# Patient Record
Sex: Male | Born: 1960 | Race: White | Hispanic: No | Marital: Married | Smoking: Current every day smoker
Health system: Southern US, Community
[De-identification: ages and names within clinical notes are randomized; demographics above are authoritative.]

## PROBLEM LIST (undated history)

## (undated) DIAGNOSIS — G4733 Obstructive sleep apnea (adult) (pediatric): Secondary | ICD-10-CM

## (undated) DIAGNOSIS — F17200 Nicotine dependence, unspecified, uncomplicated: Secondary | ICD-10-CM

## (undated) DIAGNOSIS — R413 Other amnesia: Secondary | ICD-10-CM

## (undated) DIAGNOSIS — J449 Chronic obstructive pulmonary disease, unspecified: Secondary | ICD-10-CM

## (undated) DIAGNOSIS — F419 Anxiety disorder, unspecified: Secondary | ICD-10-CM

## (undated) DIAGNOSIS — I2510 Atherosclerotic heart disease of native coronary artery without angina pectoris: Principal | ICD-10-CM

## (undated) DIAGNOSIS — E785 Hyperlipidemia, unspecified: Secondary | ICD-10-CM

## (undated) HISTORY — PX: APPENDECTOMY: SHX54

## (undated) HISTORY — DX: Obstructive sleep apnea (adult) (pediatric): G47.33

## (undated) HISTORY — DX: Other amnesia: R41.3

## (undated) HISTORY — DX: Nicotine dependence, unspecified, uncomplicated: F17.200

## (undated) HISTORY — DX: Chronic obstructive pulmonary disease, unspecified: J44.9

## (undated) HISTORY — DX: Hyperlipidemia, unspecified: E78.5

## (undated) HISTORY — DX: Anxiety disorder, unspecified: F41.9

## (undated) HISTORY — PX: CHOLECYSTECTOMY: SHX55

## (undated) HISTORY — PX: KNEE SURGERY: SHX244

## (undated) HISTORY — DX: Atherosclerotic heart disease of native coronary artery without angina pectoris: I25.10

## (undated) HISTORY — PX: LUNG BIOPSY: SHX232

---

## 2004-03-12 ENCOUNTER — Ambulatory Visit: Payer: Self-pay | Admitting: Family Medicine

## 2004-06-09 ENCOUNTER — Ambulatory Visit: Payer: Self-pay | Admitting: Internal Medicine

## 2004-07-04 ENCOUNTER — Ambulatory Visit: Payer: Self-pay | Admitting: Family Medicine

## 2004-07-30 ENCOUNTER — Ambulatory Visit: Payer: Self-pay | Admitting: Family Medicine

## 2005-04-30 ENCOUNTER — Ambulatory Visit: Payer: Self-pay | Admitting: Family Medicine

## 2005-05-21 ENCOUNTER — Ambulatory Visit: Payer: Self-pay | Admitting: Family Medicine

## 2005-06-22 ENCOUNTER — Ambulatory Visit: Payer: Self-pay | Admitting: Family Medicine

## 2005-12-04 ENCOUNTER — Ambulatory Visit: Payer: Self-pay | Admitting: Family Medicine

## 2005-12-15 ENCOUNTER — Ambulatory Visit: Payer: Self-pay | Admitting: Family Medicine

## 2006-03-12 ENCOUNTER — Ambulatory Visit: Payer: Self-pay | Admitting: Family Medicine

## 2006-04-02 ENCOUNTER — Ambulatory Visit: Payer: Self-pay | Admitting: Family Medicine

## 2006-05-10 ENCOUNTER — Ambulatory Visit: Payer: Self-pay | Admitting: Family Medicine

## 2006-05-10 LAB — CONVERTED CEMR LAB
Basophils Absolute: 0.1 10*3/uL (ref 0.0–0.1)
Basophils Relative: 0.5 % (ref 0.0–1.0)
Cholesterol: 170 mg/dL (ref 0–200)
HCT: 42.1 % (ref 39.0–52.0)
HDL: 24.7 mg/dL — ABNORMAL LOW (ref >39.0)
Hemoglobin: 14.3 g/dL (ref 13.0–17.0)
LDL Cholesterol: 108 mg/dL — ABNORMAL HIGH (ref 0–99)
Lymphocytes Relative: 21 % (ref 12.0–46.0)
MCHC: 33.9 g/dL (ref 30.0–36.0)
Monocytes Absolute: 0.6 10*3/uL (ref 0.2–0.7)
Monocytes Relative: 4 % (ref 3.0–11.0)
Neutro Abs: 10.1 10*3/uL — ABNORMAL HIGH (ref 1.4–7.7)
Neutrophils Relative %: 73.4 % (ref 43.0–77.0)
RDW: 12.9 % (ref 11.5–14.6)

## 2006-05-24 ENCOUNTER — Encounter (INDEPENDENT_AMBULATORY_CARE_PROVIDER_SITE_OTHER): Payer: Self-pay | Admitting: Internal Medicine

## 2006-07-17 ENCOUNTER — Emergency Department: Payer: Self-pay | Admitting: Emergency Medicine

## 2006-07-22 ENCOUNTER — Ambulatory Visit: Payer: Self-pay | Admitting: Family Medicine

## 2006-07-22 DIAGNOSIS — S51809A Unspecified open wound of unspecified forearm, initial encounter: Secondary | ICD-10-CM | POA: Insufficient documentation

## 2006-07-22 DIAGNOSIS — T148XXA Other injury of unspecified body region, initial encounter: Secondary | ICD-10-CM | POA: Insufficient documentation

## 2007-01-20 DIAGNOSIS — I2510 Atherosclerotic heart disease of native coronary artery without angina pectoris: Secondary | ICD-10-CM

## 2007-01-20 HISTORY — PX: CARDIAC CATHETERIZATION: SHX172

## 2007-01-20 HISTORY — DX: Atherosclerotic heart disease of native coronary artery without angina pectoris: I25.10

## 2007-01-28 ENCOUNTER — Ambulatory Visit: Payer: Self-pay | Admitting: Family Medicine

## 2007-01-28 DIAGNOSIS — F17200 Nicotine dependence, unspecified, uncomplicated: Secondary | ICD-10-CM

## 2007-01-28 DIAGNOSIS — M79609 Pain in unspecified limb: Secondary | ICD-10-CM | POA: Insufficient documentation

## 2007-01-31 ENCOUNTER — Encounter: Payer: Self-pay | Admitting: Family Medicine

## 2007-06-14 ENCOUNTER — Ambulatory Visit: Payer: Self-pay | Admitting: Family Medicine

## 2007-06-14 DIAGNOSIS — F418 Other specified anxiety disorders: Secondary | ICD-10-CM | POA: Insufficient documentation

## 2007-06-14 DIAGNOSIS — J309 Allergic rhinitis, unspecified: Secondary | ICD-10-CM | POA: Insufficient documentation

## 2007-07-25 ENCOUNTER — Encounter (INDEPENDENT_AMBULATORY_CARE_PROVIDER_SITE_OTHER): Payer: Self-pay | Admitting: *Deleted

## 2007-08-05 ENCOUNTER — Encounter (INDEPENDENT_AMBULATORY_CARE_PROVIDER_SITE_OTHER): Payer: Self-pay | Admitting: *Deleted

## 2007-09-19 LAB — HM COLONOSCOPY: HM Colonoscopy: NORMAL

## 2008-06-08 ENCOUNTER — Telehealth: Payer: Self-pay | Admitting: Family Medicine

## 2008-12-10 ENCOUNTER — Emergency Department: Payer: Self-pay | Admitting: Emergency Medicine

## 2010-10-16 ENCOUNTER — Ambulatory Visit (INDEPENDENT_AMBULATORY_CARE_PROVIDER_SITE_OTHER): Payer: BC Managed Care – PPO | Admitting: Cardiovascular Disease

## 2010-10-16 ENCOUNTER — Encounter: Payer: Self-pay | Admitting: Cardiovascular Disease

## 2010-10-16 DIAGNOSIS — I2510 Atherosclerotic heart disease of native coronary artery without angina pectoris: Secondary | ICD-10-CM

## 2010-10-16 DIAGNOSIS — E785 Hyperlipidemia, unspecified: Secondary | ICD-10-CM | POA: Insufficient documentation

## 2010-10-16 DIAGNOSIS — J984 Other disorders of lung: Secondary | ICD-10-CM

## 2010-10-16 DIAGNOSIS — F17200 Nicotine dependence, unspecified, uncomplicated: Secondary | ICD-10-CM

## 2010-10-16 NOTE — Assessment & Plan Note (Signed)
We have discussed his smoking with him. This is the main priority for his medical care. Given his underlying severe lung disease and severe coronary disease, he has to stop smoking and we have stressed this to him. His wife also smokes and we have expressed this to her as well that it will be impossible for him to stop without her stopping.

## 2010-10-16 NOTE — Assessment & Plan Note (Signed)
History of severe coronary artery disease, stenting of his distal RCA and distal circumflex also with residual disease in 2009 at the time of his previous cardiac catheterization. Currently with no significant chest pain. Last stress test was in 2009 per the notes. He continues to be high risk given his continued smoking.

## 2010-10-16 NOTE — Assessment & Plan Note (Signed)
Given his severe underlying coronary artery disease and continued smoking, goal LDL less than 70, total cholesterol less than 150. We'll try to obtain his most recent lipid panel for our records.

## 2010-10-16 NOTE — Patient Instructions (Addendum)
You are doing well. Please increase the aspirin to two a day Please call us if you have new issues that need to be addressed before your next appt.  We will call you for a follow up Appt. In 6 months

## 2010-10-16 NOTE — Progress Notes (Signed)
Patient ID: Dennis Barajas, male    DOB: 18-May-1960, 50 y.o.   MRN: [id number]  HPI Comments: Mr. 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  He started smoking when he was 17, total 30 years.  Reports that his lung function is poor. He continues to smoke one pack per day. Notes indicate COPD, history of interstitial pneumonitis, possible interstitial lung disease. He is trying to quit. He has Chantix at home though has not started using her medication. He reports the stress at home has been significant as his 4 daughters ( who do not live at home) per causing significant stress when they call. His wife also smokes.   He denies any significant chest pain. Is not on oxygen.  EKG shows normal sinus rhythm with rate 80 beats per minute with no significant ST or T wave changes   Outpatient Encounter Prescriptions as of 10/16/2010  Medication Sig Dispense Refill  . albuterol (PROVENTIL) (2.5 MG/3ML) 0.083% nebulizer solution Take 2.5 mg by nebulization every 6 (six) hours as needed.        Marland Kitchen aspirin 81 MG tablet Take 81 mg by mouth daily.        Marland Kitchen azelastine (ASTELIN) 137 MCG/SPRAY nasal spray Place 1 spray into the nose 2 (two) times daily. Use in each nostril as directed       . buPROPion (WELLBUTRIN XL) 150 MG 24 hr tablet 150 mg. Take two tablets daily.       . fluticasone-salmeterol (ADVAIR HFA) 115-21 MCG/ACT inhaler Inhale 2 puffs into the lungs 2 (two) times daily.        . hydrOXYzine (VISTARIL) 50 MG capsule  Take 50 mg by mouth 3 (three) times daily as needed.        Marland Kitchen lisinopril (PRINIVIL,ZESTRIL) 2.5 MG tablet Take 2.5 mg by mouth daily.        . niacin (NIASPAN) 1000 MG CR tablet Take 1,000 mg by mouth at bedtime.        . nitroGLYCERIN (NITROSTAT) 0.4 MG SL tablet Place 0.4 mg under the tongue every 5 (five) minutes as needed.        Marland Kitchen PARoxetine (PAXIL-CR) 25 MG 24 hr tablet Take 25 mg by mouth every morning.        . pravastatin (PRAVACHOL) 10 MG tablet Take 10 mg by mouth daily.        Marland Kitchen sulfamethoxazole-trimethoprim (BACTRIM DS) 800-160 MG per tablet 1 tablet. Take one tablet 3 times weekly.       . Testosterone (TESTIM TD) Place 1 % onto the skin 1 day or 1 dose.        . tiotropium (SPIRIVA) 18 MCG inhalation capsule Place 18 mcg into inhaler and inhale daily.           Review of Systems  Constitutional: Negative.   HENT: Negative.   Eyes: Negative.   Respiratory: Positive for cough and shortness of breath.   Cardiovascular: Negative.   Gastrointestinal: Negative.   Musculoskeletal: Negative.   Skin: Negative.   Neurological: Negative.   Hematological: Negative.  Psychiatric/Behavioral: Negative.   All other systems reviewed and are negative.    BP 135/92  Pulse 87  Ht 5\' 11"  (1.803 m)  Wt 161 lb (73.029 kg)  BMI 22.45 kg/m2   Physical Exam  Nursing note and vitals reviewed. Constitutional: He is oriented to person, place, and time. He appears well-developed and well-nourished.  HENT:  Head: Normocephalic.  Nose: Nose normal.  Mouth/Throat: Oropharynx is clear and moist.  Eyes: Conjunctivae are normal. Pupils are equal, round, and reactive to light.  Neck: Normal range of motion. Neck supple. No JVD present.  Cardiovascular: Normal rate, regular rhythm, S1 normal, S2 normal, normal heart sounds and intact distal pulses.  Exam reveals no gallop and no friction rub.   No murmur heard. Pulmonary/Chest: He is in respiratory distress. He has wheezes. He has rales. He  exhibits no tenderness.       Diffuse wheezing, rhonchi  Abdominal: Soft. Bowel sounds are normal. He exhibits no distension. There is no tenderness.  Musculoskeletal: Normal range of motion. He exhibits no edema and no tenderness.  Lymphadenopathy:    He has no cervical adenopathy.  Neurological: He is alert and oriented to person, place, and time. Coordination normal.  Skin: Skin is warm and dry. No rash noted. No erythema.  Psychiatric: He has a normal mood and affect. His behavior is normal. Judgment and thought content normal.           Assessment and Plan

## 2010-10-16 NOTE — Assessment & Plan Note (Signed)
Notes indicate interstitial lung disease, COPD, obstructive sleep apnea. CT Scan in August 2009 showed multiple bilateral pulmonary nodules, diffuse bilateral ground glass opacities with bi-basilar prominence and sub-pleural sparing. He does have followup with [facility] pulmonary for her first visit. He has indicated that he would like to see someone in [place]. We have given him a phone number to [facility].

## 2011-06-05 ENCOUNTER — Ambulatory Visit (INDEPENDENT_AMBULATORY_CARE_PROVIDER_SITE_OTHER): Payer: Medicare Other | Admitting: Pulmonary Disease

## 2011-06-05 ENCOUNTER — Encounter: Payer: Self-pay | Admitting: Pulmonary Disease

## 2011-06-05 VITALS — BP 104/70 | HR 84 | Temp 98.5°F | Ht 71.0 in | Wt 176.4 lb

## 2011-06-05 DIAGNOSIS — J310 Chronic rhinitis: Secondary | ICD-10-CM

## 2011-06-05 DIAGNOSIS — J449 Chronic obstructive pulmonary disease, unspecified: Secondary | ICD-10-CM

## 2011-06-05 DIAGNOSIS — F17200 Nicotine dependence, unspecified, uncomplicated: Secondary | ICD-10-CM

## 2011-06-05 DIAGNOSIS — J8410 Pulmonary fibrosis, unspecified: Secondary | ICD-10-CM

## 2011-06-05 MED ORDER — PREDNISONE 20 MG PO TABS: 20.0000 mg | ORAL_TABLET | Freq: Every day | ORAL | Status: DC

## 2011-06-05 MED ORDER — SULFAMETHOXAZOLE-TMP DS 800-160 MG PO TABS: 1.0000 | ORAL_TABLET | ORAL | Status: DC

## 2011-06-05 NOTE — Progress Notes (Signed)
Subjective:    Patient ID: Dennis Barajas, male    DOB: 02/06/1960, 51 y.o.   MRN: [id number]  HPI Dennis Barajas is a 51 y/o smoker who is seeing [place] today for the first time for evaluation of ILD and COPD.  He was diagnosed with "chronic pneumonia" due to "chemical damage" after a VATS biopsy at [facility].  He was started on prednisone 60mg  initially and this has been tapered to 20mg  over the years.  He has also been diagnosed with COPD by [facility] pulmonary fellows clinic and has been followed there and once by Dennis Barajas here in [place].  He states that he has chronic daily sputum production (usually clear), chest congestion, sinus congestion, and dyspnea on exertion.  He gets short of breath with minimal exertion in the house (just a few steps).  He cannot walk more than 4 steps.  He does not develop chest pain typically with his shortness of breath and he does not have leg swelling.  He has coronary artery disease and is followed by Dennis Barajas.   A few months ago he had pneumonia but says that he is recovering well. Over the years he has had to have his prednisone increased at times to treat pneumonia. 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    He has struggled with tobacco abuse over the years and states that he has often come close to quitting, but can never smoke less than 10 cigarettes a day.  He has tried Chantix, wellbutrin, and nicotine patches.  Past Medical History  Diagnosis Date  . COPD (chronic obstructive pulmonary disease)   . OSA (obstructive sleep apnea)   . Smoker   . Acute sinusitis   . Anxiety   . Hyperlipidemia   . Memory loss   . CAD (coronary artery disease) 2009    stent placement @ [facility]     Family History  Problem Relation Age of Onset  . Testicular cancer Brother      History   Social History    . Marital Status: Married    Spouse Name: N/A    Number of Children: N/A  . Years of Education: N/A   Occupational History  . Not on file.   Social History Main Topics  . Smoking status: Current Everyday Smoker -- 1.0 packs/day for 32 years    Types: Cigarettes  . Smokeless tobacco: Never Used  . Alcohol Use: No  . Drug Use: No  . Sexually Active: Not on file   Other Topics Concern  . Not on file   Social History Narrative  . No narrative on file     Allergies  Allergen Reactions  . Clarithromycin     REACTION: [place]  . Penicillins     REACTION: [place]     Outpatient Prescriptions Prior to Visit  Medication Sig Dispense Refill  . albuterol (PROVENTIL) (2.5 MG/3ML) 0.083% nebulizer solution Take 2.5 mg by nebulization every 6 (six) hours as needed.        Marland Kitchen aspirin 81 MG tablet Take 81 mg by mouth daily.        Marland Kitchen buPROPion (WELLBUTRIN XL) 150 MG 24 hr tablet 150 mg. Take two tablets daily.       . fluticasone-salmeterol (ADVAIR HFA) 115-21 MCG/ACT inhaler Inhale 2 puffs into the lungs 2 (  two) times daily.        . hydrOXYzine (VISTARIL) 50 MG capsule Take 50 mg by mouth 3 (three) times daily as needed.        Marland Kitchen lisinopril (PRINIVIL,ZESTRIL) 2.5 MG tablet Take 2.5 mg by mouth daily.        . niacin (NIASPAN) 1000 MG CR tablet Take 1,000 mg by mouth at bedtime.        . nitroGLYCERIN (NITROSTAT) 0.4 MG SL tablet Place 0.4 mg under the tongue every 5 (five) minutes as needed.        Marland Kitchen PARoxetine (PAXIL-CR) 25 MG 24 hr tablet Take 25 mg by mouth every morning.        . sulfamethoxazole-trimethoprim (BACTRIM DS) 800-160 MG per tablet 1 tablet. Take one tablet 3 times weekly.       Marland Kitchen tiotropium (SPIRIVA) 18 MCG inhalation capsule Place 18 mcg into inhaler and inhale daily.        Marland Kitchen azelastine (ASTELIN) 137 MCG/SPRAY nasal spray Place 1 spray into the nose 2 (two) times daily. Use in each nostril as directed       . pravastatin (PRAVACHOL) 10 MG tablet Take 10 mg by mouth daily.         . Testosterone (TESTIM TD) Place 1 % onto the skin 1 day or 1 dose.        Prednisone 20mg  daily    Review of Systems  Constitutional: Negative for fever, chills, activity change and appetite change.  HENT: Positive for congestion, rhinorrhea and postnasal drip. Negative for hearing loss, ear pain, sneezing, neck pain, neck stiffness and sinus pressure.   Eyes: Negative for redness, itching and visual disturbance.  Respiratory: Positive for cough, shortness of breath and wheezing. Negative for chest tightness.   Cardiovascular: Negative for chest pain, palpitations and leg swelling.  Gastrointestinal: Negative for nausea, vomiting, abdominal pain, diarrhea, constipation, blood in stool and abdominal distention.  Musculoskeletal: Negative for myalgias, joint swelling, arthralgias and gait problem.  Skin: Negative for rash.  Neurological: Negative for dizziness, light-headedness, numbness and headaches.  Hematological: Does not bruise/bleed easily.  Psychiatric/Behavioral: Negative for confusion and dysphoric mood.       Objective:   Physical Exam Filed Vitals:   06/05/11 1620  BP: 104/70  Pulse: 84  Temp: 98.5 F (36.9 C)  TempSrc: Oral  Height: 5\' 11"  (1.803 m)  Weight: 176 lb 6.4 oz (80.015 kg)  SpO2: 95%   Gen: chronically ill appearing, no acute distress HEENT: NCAT, PERRL, EOMi, OP clear, cushingoid face PULM: Inspiratory crackles bilaterally but loudest in R base and R mid lung, exp wheezing elsewhere CV: RRR, no mgr, no JVD AB: BS+, soft, nontender, no hsm Ext: warm, no edema, no clubbing, no cyanosis Derm: thin, bruised skin over arms Neuro: A&Ox4, CN II-XII intact, strength 5/5 in all 4 extremities      Assessment & Plan:   COPD (chronic obstructive pulmonary disease) Dennis Barajas has a modified research council score of 4 COPD which makes him a 2011 GOLD C or D.  He had recent full lung function testing at Dennis Barajas office.  Based on the fact that he is  currently on full dose therapy, there is no indication for me to get spirometry today so we will get the records of the recent PFT's.    The most important thing that he can do for his COPD at this point is to quit smoking.  We discussed this at length.  Plan: -obtain  records from [facility] and [facility] -continue current therapies -quit smoking   Pulmonary fibrosis Unclear etiology, but apparently treated with chronic prednisone so I assume he has either COP, CEP, or NSIP.    Plan: -continue prednisone 20mg  daily -continue PCP prophylaxis with bactrim -obtain records from [facility] ABUSE Discussed at length today and reviewed the negative ramifications of smoking.  He wants to quit but has never been able to go below 10 cigs a day.    Plan: -cont Wellbutrin -call [place] [facility] for free patches  Chronic rhinitis I think he has a component of both vasomotor rhinitis and allergic rhinitis.  Vasomotor rhinitis (chronic non-allergic rhinitis): When inhaled corticosteroids or antihistamines are not helpful, ipratropium nasal spray (0.03%, 2 puffs each nare tid) is often effective as demonstrated in two trials of 285 and 253 patients (J Allergy Clin Immunol. 4098;11(9 Pt 2):1123. nd J Allergy Clin Immunol. 1478;29(5 Pt 2):1117).  -start ipratropium nasal spray (0.03%) 2 puffs bid-tid prn, watch for urinary obstruction and blurry vision, dry eyes while on spiriva     Updated Medication List Outpatient Encounter Prescriptions as of 06/05/2011  Medication Sig Dispense Refill  . albuterol (PROAIR HFA) 108 (90 BASE) MCG/ACT inhaler Inhale 2 puffs into the lungs every 6 (six) hours as needed.      Marland Kitchen albuterol (PROVENTIL) (2.5 MG/3ML) 0.083% nebulizer solution Take 2.5 mg by nebulization every 6 (six) hours as needed.        Marland Kitchen aspirin 81 MG tablet Take 81 mg by mouth daily.        Marland Kitchen buPROPion (WELLBUTRIN XL) 150 MG 24 hr tablet 150 mg. Take two tablets daily.       . fluticasone  (FLONASE) 50 MCG/ACT nasal spray Place 2 sprays into the nose 2 (two) times daily.      . fluticasone-salmeterol (ADVAIR HFA) 115-21 MCG/ACT inhaler Inhale 2 puffs into the lungs 2 (two) times daily.        . hydrOXYzine (VISTARIL) 50 MG capsule Take 50 mg by mouth 3 (three) times daily as needed.        Marland Kitchen lisinopril (PRINIVIL,ZESTRIL) 2.5 MG tablet Take 2.5 mg by mouth daily.        . niacin (NIASPAN) 1000 MG CR tablet Take 1,000 mg by mouth at bedtime.        . nitroGLYCERIN (NITROSTAT) 0.4 MG SL tablet Place 0.4 mg under the tongue every 5 (five) minutes as needed.        Marland Kitchen omeprazole (PRILOSEC) 20 MG capsule Take 20 mg by mouth daily.      Marland Kitchen PARoxetine (PAXIL-CR) 25 MG 24 hr tablet Take 25 mg by mouth every morning.        . predniSONE (DELTASONE) 20 MG tablet Take 1 tablet (20 mg total) by mouth daily.  90 tablet  2  . sulfamethoxazole-trimethoprim (BACTRIM DS) 800-160 MG per tablet Take 1 tablet by mouth every Monday, Wednesday, and Friday. Take one tablet 3 times weekly.  40 tablet  2  . tiotropium (SPIRIVA) 18 MCG inhalation capsule Place 18 mcg into inhaler and inhale daily.        . valsartan (DIOVAN) 80 MG tablet Take 40 mg by mouth daily.      Marland Kitchen DISCONTD: predniSONE (DELTASONE) 20 MG tablet Take 20 mg by mouth daily.      Marland Kitchen DISCONTD: sulfamethoxazole-trimethoprim (BACTRIM DS) 800-160 MG per tablet 1 tablet. Take one tablet 3 times weekly.       Marland Kitchen  DISCONTD: azelastine (ASTELIN) 137 MCG/SPRAY nasal spray Place 1 spray into the nose 2 (two) times daily. Use in each nostril as directed       . DISCONTD: pravastatin (PRAVACHOL) 10 MG tablet Take 10 mg by mouth daily.        Marland Kitchen DISCONTD: Testosterone (TESTIM TD) Place 1 % onto the skin 1 day or 1 dose.

## 2011-06-05 NOTE — Assessment & Plan Note (Signed)
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    Plan: -continue prednisone 20mg  daily -continue PCP prophylaxis with bactrim -obtain records from [facility]

## 2011-06-05 NOTE — Assessment & Plan Note (Signed)
Dennis Barajas has a modified research council score of 4 COPD which makes him a 2011 GOLD C or D.  He had recent full lung function testing at Dennis Barajas office.  Based on the fact that he is currently on full dose therapy, there is no indication for me to get spirometry today so we will get the records of the recent PFT's.    The most important thing that he can do for his COPD at this point is to quit smoking.  We discussed this at length.  Plan: -obtain records from [facility] and Dennis Barajas -continue current therapies -quit smoking

## 2011-06-05 NOTE — Patient Instructions (Signed)
Use neil med rinses (can buy over the counter) with distilled water two or three times per day for you sinus drainage Try ipratropium nasal spray two to three times per day as needed for sinus drainage, watch out for urinary obstruction or eye problems with this. Call the [facility] [id number] to get free nicotine patches from the [facility] We will see you back in 3 months or sooner if needed

## 2011-06-05 NOTE — Assessment & Plan Note (Signed)
I think he has a component of both vasomotor rhinitis and allergic rhinitis.  Vasomotor rhinitis (chronic non-allergic rhinitis): When inhaled corticosteroids or antihistamines are not helpful, ipratropium nasal spray (0.03%, 2 puffs each nare tid) is often effective as demonstrated in two trials of 285 and 253 patients (J Allergy Clin Immunol. 1610;96(0 Pt 2):1123. nd J Allergy Clin Immunol. 4540;98(1 Pt 2):1117).  -start ipratropium nasal spray (0.03%) 2 puffs bid-tid prn, watch for urinary obstruction and blurry vision, dry eyes while on spiriva

## 2011-06-05 NOTE — Assessment & Plan Note (Signed)
Discussed at length today and reviewed the negative ramifications of smoking.  He wants to quit but has never been able to go below 10 cigs a day.    Plan: -cont Wellbutrin -call [place] QUIT[facility] for free patches

## 2011-06-05 NOTE — Progress Notes (Signed)
  Subjective:    Patient ID: Dennis Barajas, male    DOB: [birth year], 51 y.o.   MRN: [id number]  HPI    Review of Systems  Constitutional: Negative for fever, chills, activity change and appetite change.  HENT: Positive for postnasal drip. Negative for hearing loss, ear pain, congestion, rhinorrhea, sneezing, neck pain, neck stiffness and sinus pressure.   Eyes: Negative for redness, itching and visual disturbance.  Respiratory: Positive for cough, shortness of breath and wheezing. Negative for chest tightness.   Cardiovascular: Negative for chest pain, palpitations and leg swelling.  Gastrointestinal: Negative for nausea, vomiting, abdominal pain, diarrhea, constipation, blood in stool and abdominal distention.  Musculoskeletal: Negative for myalgias, joint swelling, arthralgias and gait problem.  Skin: Negative for rash.  Neurological: Positive for headaches. Negative for dizziness, light-headedness and numbness.  Hematological: Does not bruise/bleed easily.  Psychiatric/Behavioral: Positive for dysphoric mood. Negative for confusion.       Objective:   Physical Exam        Assessment & Plan:

## 2011-06-12 ENCOUNTER — Encounter: Payer: Self-pay | Admitting: Pulmonary Disease

## 2011-06-17 ENCOUNTER — Telehealth: Payer: Self-pay | Admitting: Pulmonary Disease

## 2011-06-17 ENCOUNTER — Encounter: Payer: Self-pay | Admitting: Internal Medicine

## 2011-06-17 ENCOUNTER — Ambulatory Visit (INDEPENDENT_AMBULATORY_CARE_PROVIDER_SITE_OTHER): Payer: Medicare Other | Admitting: Internal Medicine

## 2011-06-17 VITALS — BP 122/74 | HR 110 | Temp 98.1°F | Resp 18 | Ht 71.0 in | Wt 176.8 lb

## 2011-06-17 DIAGNOSIS — N390 Urinary tract infection, site not specified: Secondary | ICD-10-CM

## 2011-06-17 DIAGNOSIS — R1031 Right lower quadrant pain: Secondary | ICD-10-CM

## 2011-06-17 DIAGNOSIS — R351 Nocturia: Secondary | ICD-10-CM

## 2011-06-17 DIAGNOSIS — G4733 Obstructive sleep apnea (adult) (pediatric): Secondary | ICD-10-CM

## 2011-06-17 DIAGNOSIS — F17200 Nicotine dependence, unspecified, uncomplicated: Secondary | ICD-10-CM

## 2011-06-17 DIAGNOSIS — G4730 Sleep apnea, unspecified: Secondary | ICD-10-CM

## 2011-06-17 DIAGNOSIS — J984 Other disorders of lung: Secondary | ICD-10-CM

## 2011-06-17 LAB — POCT URINALYSIS DIPSTICK
Glucose, UA: NEGATIVE
Nitrite, UA: NEGATIVE
Spec Grav, UA: 1.02
Urobilinogen, UA: 0.2

## 2011-06-17 MED ORDER — BUPROPION HCL ER (XL) 150 MG PO TB24: 300.0000 mg | ORAL_TABLET | Freq: Every day | ORAL | Status: DC

## 2011-06-17 MED ORDER — FLUTICASONE-SALMETEROL 115-21 MCG/ACT IN AERO
2.0000 | INHALATION_SPRAY | Freq: Two times a day (BID) | RESPIRATORY_TRACT | Status: DC
Start: 2011-06-17 — End: 2012-08-09

## 2011-06-17 MED ORDER — PAROXETINE HCL ER 25 MG PO TB24: 25.0000 mg | ORAL_TABLET | ORAL | Status: DC

## 2011-06-17 MED ORDER — ALBUTEROL SULFATE HFA 108 (90 BASE) MCG/ACT IN AERS: 2.0000 | INHALATION_SPRAY | Freq: Four times a day (QID) | RESPIRATORY_TRACT | Status: DC | PRN

## 2011-06-17 NOTE — Assessment & Plan Note (Addendum)
Spent 5 minutes discussing his ongoing tobacco use despite history of CAD, COPD and ILD.  He understands the risks of continued use but has been unable to abstain due to addiction and is using a nicoderm patch to reduce his consumption to 10 cigarettes daily .  Discussed ways to reduce even more., including weekly reduction ,  Hypnosis.  Etc.

## 2011-06-17 NOTE — Telephone Encounter (Signed)
Verlon Au have you received any records on this pt. Please advise thanks

## 2011-06-17 NOTE — Progress Notes (Signed)
Patient ID: Dennis Barajas, male   DOB: 1960/02/11, 51 y.o.   MRN: [id number]  Patient Active Problem List  Diagnoses  . TOBACCO ABUSE  . DEPRESSIVE DISORDER NOT ELSEWHERE CLASSIFIED  . THUMB PAIN, RIGHT  . CAD (coronary artery disease)  . Hyperlipidemia LDL goal <70  . Lung disease  . DIP and RBILD  . COPD (chronic obstructive pulmonary disease)  . Chronic rhinitis  . Right inguinal pain  . Obstructive sleep apnea    Subjective:  CC:   Chief Complaint  Patient presents with  . New Patient    HPI:   Dennis Barajas a 51 y.o. male who presentsAs a new patient with cc right groin pain.   He has a history of ongoing tobacco abuse and COPD complicated by intersititial lung disease who has been having pain in the right groin, aggravated by increased cough at night due to his CPAP not working correctly and needing cleaning . The pain has been present for several weeks. He has had a prior evaluation by someone at [facility] clinic for hernia, referred by his urologist, told he did not have one. Denies any recent heavy lifting or unusual activities.  No change in bowel habit or urinary habits but does note increased urination at night.  His most recent hospitalization was for an appendectomy when he presented with recurrent diarrhea and wt loss of 40 lbs in 3 -4 months which was attributed to chronic appendicitis. Has reduced his smoking from 1 pack daily to 10 cigs daily using Nicoderm patches.  He has had 2 prior trials of Chantix which were unsuccessful.  He has  been taking wellbutrin and paxil for years.  Suffers from anxiety and insomnia. He has no history of suicidal ideation.  He has noticed residue in the tubing of his CPAP machine but has had had difficulty getting his CPAP machine serviced and parts replaced but needs a rx.      Past Medical History  Diagnosis Date  . COPD (chronic obstructive pulmonary disease)   . OSA (obstructive sleep apnea)   . Smoker   . Acute sinusitis   .  Anxiety   . Hyperlipidemia   . Memory loss   . CAD (coronary artery disease) 2009    stent placement @ [facility]    Past Surgical History  Procedure Date  . Cholecystectomy   . Appendectomy   . Knee surgery     bilateral knee surgery  . Cardiac catheterization 2009    stent placed @ [facility]          The following portions of the patient's history were reviewed and updated as appropriate: Allergies, current medications, and problem list.    Review of Systems:   12 Pt  review of systems was negative except those addressed in the HPI,     History   Social History  . Marital Status: Married    Spouse Name: N/A    Number of Children: N/A  . Years of Education: N/A   Occupational History  . Not on file.   Social History Main Topics  . Smoking status: Current Everyday Smoker -- 1.0 packs/day for 32 years    Types: Cigarettes  . Smokeless tobacco: Never Used  . Alcohol Use: No  . Drug Use: No  . Sexually Active: Not on file   Other Topics Concern  . Not on file   Social History Narrative  . No narrative on file    Objective:  BP 122/74  Pulse 110  Temp(Src) 98.1 F (36.7 C) (Oral)  Resp 18  Ht 5\' 11"  (1.803 m)  Wt 176 lb 12 oz (80.173 kg)  BMI 24.65 kg/m2  SpO2 95%  General appearance: alert, cooperative and appears stated age Ears: normal TM's and external ear canals both ears Throat: lips, mucosa, and tongue normal; teeth and gums normal Neck: no adenopathy, no carotid bruit, supple, symmetrical, trachea midline and thyroid not enlarged, symmetric, no tenderness/mass/nodules Back: symmetric, no curvature. ROM normal. No CVA tenderness. Lungs: clear to auscultation bilaterally Heart: regular rate and rhythm, S1, S2 normal, no murmur, click, rub or gallop Abdomen: soft, non-tender; bowel sounds normal; no masses,  no organomegaly Pulses: 2+ and symmetric Skin: Skin color, texture, turgor normal. No rashes or lesions Lymph nodes: Cervical, supraclavicular,  and axillary nodes normal.  Assessment and Plan:  TOBACCO ABUSE Spent 5 minutes discussing his ongoing tobacco use despite history of CAD, COPD and ILD.  He understands the risks of continued use but has been unable to abstain due to addiction and is using a nicoderm patch to reduce his consumption to 10 cigarettes daily .  Discussed ways to reduce even more., including weekly reduction ,  Hypnosis.  Etc.   Right inguinal pain His exam is pretty benign.  He may have an slight inguinal hernia but not enough to warrant surgical evaluation.  May be a psoas muscle of more superficial muscle strained by coughing while supine.  Recommended using the corset he has mentioned and using a pillow roll under his legsat night while lying supine to relax abdominal wall muscles at night and prevent recurrent stressing of lower abdominal muscles with cough.  Lung disease Multiple processes, including COPD and DIP by biopsy obtained  via VATS procedure in Dec 2009 at [facility].  He is now managed by [facility] in [place].    Obstructive sleep apnea Per patient. By prior sleep study,  Records requested from [facility] rx for replacement parts for CPAP mask and tubing given.      Updated Medication List Outpatient Encounter Prescriptions as of 06/17/2011  Medication Sig Dispense Refill  . albuterol (PROAIR HFA) 108 (90 BASE) MCG/ACT inhaler Inhale 2 puffs into the lungs every 6 (six) hours as needed.  3 Inhaler  3  . albuterol (PROVENTIL) (2.5 MG/3ML) 0.083% nebulizer solution Take 2.5 mg by nebulization every 6 (six) hours as needed.        Marland Kitchen aspirin 81 MG tablet Take 81 mg by mouth daily.        Marland Kitchen buPROPion (WELLBUTRIN XL) 150 MG 24 hr tablet Take 2 tablets (300 mg total) by mouth daily. Take two tablets daily.  180 tablet  3  . fluticasone (FLONASE) 50 MCG/ACT nasal spray Place 2 sprays into the nose 2 (two) times daily.      . fluticasone-salmeterol (ADVAIR HFA) 115-21 MCG/ACT inhaler Inhale 2 puffs into  the lungs 2 (two) times daily.  3 Inhaler  3  . hydrOXYzine (VISTARIL) 50 MG capsule Take 50 mg by mouth 3 (three) times daily as needed.        . niacin (NIASPAN) 1000 MG CR tablet Take 1,000 mg by mouth at bedtime.        . nitroGLYCERIN (NITROSTAT) 0.4 MG SL tablet Place 0.4 mg under the tongue every 5 (five) minutes as needed.        Marland Kitchen omeprazole (PRILOSEC) 20 MG capsule Take 20 mg by mouth daily.      Marland Kitchen PARoxetine (PAXIL-CR)  25 MG 24 hr tablet Take 1 tablet (25 mg total) by mouth every morning.  90 tablet  3  . pravastatin (PRAVACHOL) 10 MG tablet Take 10 mg by mouth daily.      . predniSONE (DELTASONE) 20 MG tablet Take 1 tablet (20 mg total) by mouth daily.  90 tablet  2  . sulfamethoxazole-trimethoprim (BACTRIM DS) 800-160 MG per tablet Take 1 tablet by mouth every Monday, Wednesday, and Friday. Take one tablet 3 times weekly.  40 tablet  2  . tiotropium (SPIRIVA) 18 MCG inhalation capsule Place 18 mcg into inhaler and inhale daily.        Marland Kitchen DISCONTD: albuterol (PROAIR HFA) 108 (90 BASE) MCG/ACT inhaler Inhale 2 puffs into the lungs every 6 (six) hours as needed.      Marland Kitchen DISCONTD: buPROPion (WELLBUTRIN XL) 150 MG 24 hr tablet 150 mg. Take two tablets daily.       Marland Kitchen DISCONTD: fluticasone-salmeterol (ADVAIR HFA) 115-21 MCG/ACT inhaler Inhale 2 puffs into the lungs 2 (two) times daily.        Marland Kitchen DISCONTD: lisinopril (PRINIVIL,ZESTRIL) 2.5 MG tablet Take 2.5 mg by mouth daily.        Marland Kitchen DISCONTD: PARoxetine (PAXIL-CR) 25 MG 24 hr tablet Take 25 mg by mouth every morning.        . valsartan (DIOVAN) 80 MG tablet Take 40 mg by mouth daily.         Orders Placed This Encounter  Procedures  . AMB REFERRAL FOR DME  . POCT Urinalysis Dipstick    No Follow-up on file.

## 2011-06-18 ENCOUNTER — Encounter: Payer: Self-pay | Admitting: Internal Medicine

## 2011-06-18 ENCOUNTER — Telehealth: Payer: Self-pay | Admitting: Internal Medicine

## 2011-06-18 ENCOUNTER — Other Ambulatory Visit: Payer: Self-pay | Admitting: Cardiovascular Disease

## 2011-06-18 DIAGNOSIS — R1031 Right lower quadrant pain: Secondary | ICD-10-CM | POA: Insufficient documentation

## 2011-06-18 MED ORDER — LISINOPRIL 2.5 MG PO TABS: 2.5000 mg | ORAL_TABLET | Freq: Every day | ORAL | Status: DC

## 2011-06-18 NOTE — Telephone Encounter (Signed)
Pt's wife stated that she is specifically looking to get a copy of the sleep study around 4 years ago.  Pt wife stated that they are in need of a copy to get his CPAp machine fixed.  Pt's wife can be reached [phone].  Antionette Fairy

## 2011-06-18 NOTE — Assessment & Plan Note (Addendum)
His exam is pretty benign.  He may have an slight inguinal hernia but not enough to warrant surgical evaluation.  May be a psoas muscle of more superficial muscle strained by coughing while supine.  Recommended using the corset he has mentioned and using a pillow roll under his legsat night while lying supine to relax abdominal wall muscles at night and prevent recurrent stressing of lower abdominal muscles with cough.

## 2011-06-18 NOTE — Telephone Encounter (Signed)
Dennis Barajas STATED THAT HIS INSURANCE IN NOT IN NET WORK  AND THEY WILL NOT BE ABLE TO HELP HIM

## 2011-06-18 NOTE — Telephone Encounter (Signed)
I do not see the records that spouse is asking about. Spoke with her and she states that was informed by someone at the [facility] that we did receive the records. Dr. Kendrick Fries, do you remember seeing them? Spouse is requesting copies of some of the information that was supposed to have been faxed. She is aware that we are not back in [place] until 06/22/11. Please advise, thanks

## 2011-06-20 ENCOUNTER — Encounter: Payer: Self-pay | Admitting: Internal Medicine

## 2011-06-20 DIAGNOSIS — G4733 Obstructive sleep apnea (adult) (pediatric): Secondary | ICD-10-CM | POA: Insufficient documentation

## 2011-06-20 NOTE — Assessment & Plan Note (Addendum)
Per patient. By prior sleep study,  Records requested from [facility] rx for replacement parts for CPAP mask and tubing given.

## 2011-06-20 NOTE — Assessment & Plan Note (Signed)
Multiple processes, including COPD and DIP by biopsy obtained  via VATS procedure in Dec 2009 at [facility].  He is now managed by [facility] in [place].

## 2011-06-22 NOTE — Telephone Encounter (Signed)
I just signed a big stack of [facility] records but don't recall there being a sleep study in there.

## 2011-06-22 NOTE — Telephone Encounter (Signed)
Spoke with Dennis Barajas and notified of recs per BQ. She states will try and get the records by calling her self or go and siogn release. She states nothing further needed.

## 2011-07-16 ENCOUNTER — Telehealth: Payer: Self-pay | Admitting: Pulmonary Disease

## 2011-07-16 NOTE — Telephone Encounter (Signed)
DELSYM 2 tsp thrice daily STOP lisinopril - keep record of Bp Call us for antibiotic if green/ yellow phlegm Defer further recommendations to dr Kendrick Fries

## 2011-07-16 NOTE — Telephone Encounter (Signed)
Called and spoke with pts wife and she is aware of RA recs.  She will keep a record of BPS for now and call back for any concerns.

## 2011-07-16 NOTE — Telephone Encounter (Signed)
Called and spoke with pts wife and she stated that pt has been having a non productive cough x 3 days.  Unable to lay down to sleep due to congestion in his chest.  Unable to sleep.  Increase SOB.  Pt now has a new cpap machine and this has helped some when he can wear it.  Dr. Kendrick Fries is out of the office all week.  RA  Please advise. Thanks  Allergies  Allergen Reactions  . Clarithromycin     REACTION: [place]  . Penicillins     REACTION: [place]

## 2011-07-31 ENCOUNTER — Telehealth: Payer: Self-pay | Admitting: Pulmonary Disease

## 2011-07-31 NOTE — Telephone Encounter (Signed)
Mrs. Degen called in and states that Dr. 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  If you need to talk with the nurse her direct number is [phone].Mrs. Mucci states she dropped this paperwork off last week.

## 2011-08-03 NOTE — Telephone Encounter (Signed)
Done- forms faxed to the number given and Jasmine December aware and states nothing further needed.

## 2011-10-22 ENCOUNTER — Ambulatory Visit: Payer: Medicare Other | Admitting: Pulmonary Disease

## 2011-10-23 ENCOUNTER — Ambulatory Visit (INDEPENDENT_AMBULATORY_CARE_PROVIDER_SITE_OTHER): Payer: Medicare Other | Admitting: Pulmonary Disease

## 2011-10-23 ENCOUNTER — Encounter: Payer: Self-pay | Admitting: Pulmonary Disease

## 2011-10-23 ENCOUNTER — Ambulatory Visit (INDEPENDENT_AMBULATORY_CARE_PROVIDER_SITE_OTHER)
Admission: RE | Admit: 2011-10-23 | Discharge: 2011-10-23 | Disposition: A | Discharge status: Home / Self Care | Payer: Medicare Other | Source: Ambulatory Visit | Attending: Pulmonary Disease | Admitting: Pulmonary Disease

## 2011-10-23 VITALS — BP 116/66 | HR 90 | Temp 98.2°F | Ht 71.0 in | Wt 172.8 lb

## 2011-10-23 DIAGNOSIS — J4489 Other specified chronic obstructive pulmonary disease: Secondary | ICD-10-CM

## 2011-10-23 DIAGNOSIS — J449 Chronic obstructive pulmonary disease, unspecified: Secondary | ICD-10-CM

## 2011-10-23 DIAGNOSIS — F17200 Nicotine dependence, unspecified, uncomplicated: Secondary | ICD-10-CM

## 2011-10-23 DIAGNOSIS — J8410 Pulmonary fibrosis, unspecified: Secondary | ICD-10-CM

## 2011-10-23 DIAGNOSIS — Z23 Encounter for immunization: Secondary | ICD-10-CM

## 2011-10-23 MED ORDER — LEVOFLOXACIN 500 MG PO TABS: 500.0000 mg | ORAL_TABLET | Freq: Every day | ORAL | Status: AC

## 2011-10-23 MED ORDER — PREDNISONE 20 MG PO TABS: 20.0000 mg | ORAL_TABLET | Freq: Every day | ORAL | Status: DC

## 2011-10-23 NOTE — Patient Instructions (Addendum)
Go get a chest x-ray at the [facility] this afternoon Increase your prednisone to 40mg  daily for two weeks, then take 30mg  daily for two weeks, then resume taking 20mg  daily Use the nicotine patch to quit smoking Take the levaquin daily for one week Continue using your albuterol, tiotropium, and advair as you are doing We will see you back in 1-2 months

## 2011-10-23 NOTE — Assessment & Plan Note (Addendum)
In thirty minutes of consultation we discussed at length the fact that his lung diseases are due solely to his tobacco use.  We discussed multiple treatment options and he refused most.  He is willing to try the nicotine patches again.

## 2011-10-23 NOTE — Telephone Encounter (Signed)
Not sure who Steward Drone is or where she is calling from.  Closing this encounter since it was opened in May.

## 2011-10-23 NOTE — Progress Notes (Signed)
Quick Note:  Spoke with pt's spouse and notified of results per Dr. Kendrick Fries. She verbalized understanding, will inform the pt ______

## 2011-10-23 NOTE — Assessment & Plan Note (Signed)
Explained to him today that this is definitely due to his smoking.  He and his wife insist it is due to acetic acid exposure from work. I am afraid that it may be worse today given his increasing chest congestion and cough.  Plan: -increase prednisone for one month -continue bactrim -CXR -walk -see tobacco abuse

## 2011-10-23 NOTE — Progress Notes (Signed)
Subjective:    Patient ID: Dennis Barajas, male    DOB: 1960/10/11, 51 y.o.   MRN: [id number]  Synopsis: Tayvion Lauder first saw the [facility] pulmonary clinic in may of 2013 for shortness of breath. In 2009 he underwent an open lung biopsy at [facility] which showed DIP and RB ILD. He was also diagnosed with COPD and in 2011 his FEV1 was 53% predicted. He continues to use cigarettes on a regular basis. He previously was exposed to acetic acid when working at a [employer].  HPI  10/23/11 ROV -- Hezakiah continues to smoke one half pack to one full pack of cigarettes daily. He states that he's been having a lot more chest congestion and cough and is getting up thick yellow sputum. He denies fevers or chills. He says he does not have frank chest pain but he does have some pain in his ribs after lengthy frequent coughing spells. This is also been associated with a slight increase in shortness of breath. Overall he says he's been feeling worse with more malaise in the last 2 months. He is still taking all of his medications as written. This includes prednisone, Bactrim, Spiriva, Advair, and albuterol. He has questions today about how to use his albuterol appropriately.  Past Medical History  Diagnosis Date  . COPD (chronic obstructive pulmonary disease)   . OSA (obstructive sleep apnea)   . Smoker   . Acute sinusitis   . Anxiety   . Hyperlipidemia   . Memory loss   . CAD (coronary artery disease) 2009    stent placement @ [facility]      Review of Systems  Constitutional: Positive for fatigue. Negative for fever, chills and diaphoresis.  HENT: Negative for congestion, rhinorrhea and postnasal drip.   Respiratory: Positive for cough and shortness of breath. Negative for chest tightness and wheezing.   Cardiovascular: Negative for chest pain and leg swelling.       Objective:   Physical Exam  Filed Vitals:   10/23/11 1403  BP: 116/66  Pulse: 90  Temp: 98.2 F (36.8 C)    TempSrc: Oral  Height: 5\' 11"  (1.803 m)  Weight: 172 lb 12.8 oz (78.382 kg)  SpO2: 92%   Gen: chronically ill appearing, moon facies, no acute distress HEENT: NCAT, PERRL, EOMi, OP clear, neck supple without masses PULM: CTA B CV: crackles worse in bases, wheezes in upper lobes AB: BS+, soft, nontender, no hsm Ext: warm, no edema, no clubbing, no cyanosis         Assessment & Plan:   COPD (chronic obstructive pulmonary disease) He likely has a mild exacerbation right now given his chest congestion and cough.    Plan: -levaquin po daily for 7 days -continue advair, and spiriva and albuterol -consider stopping spiriva on next visit (not sure it is adding much) -walk today to make sure he doesn't need oxygen -see tobacco abuse  DIP and RBILD Explained to him today that this is definitely due to his smoking.  He and his wife insist it is due to acetic acid exposure from work. I am afraid that it may be worse today given his increasing chest congestion and cough.  Plan: -increase prednisone for one month -continue bactrim -CXR -walk -see tobacco abuse  TOBACCO ABUSE In thirty minutes of consultation we discussed at length the fact that his lung diseases are due solely to his tobacco use.  We discussed multiple treatment options and he refused most.  He is  willing to try the nicotine patches again.   Updated Medication List Outpatient Encounter Prescriptions as of 10/23/2011  Medication Sig Dispense Refill  . albuterol (PROAIR HFA) 108 (90 BASE) MCG/ACT inhaler Inhale 2 puffs into the lungs every 6 (six) hours as needed.  3 Inhaler  3  . albuterol (PROVENTIL) (2.5 MG/3ML) 0.083% nebulizer solution Take 2.5 mg by nebulization every 6 (six) hours as needed.        Marland Kitchen aspirin 81 MG tablet Take 81 mg by mouth daily.        Marland Kitchen buPROPion (WELLBUTRIN XL) 150 MG 24 hr tablet Take 2 tablets (300 mg total) by mouth daily. Take two tablets daily.  180 tablet  3  . fluticasone  (FLONASE) 50 MCG/ACT nasal spray Place 2 sprays into the nose 2 (two) times daily.      . fluticasone-salmeterol (ADVAIR HFA) 115-21 MCG/ACT inhaler Inhale 2 puffs into the lungs 2 (two) times daily.  3 Inhaler  3  . hydrOXYzine (VISTARIL) 50 MG capsule Take 50 mg by mouth 3 (three) times daily as needed.        Marland Kitchen lisinopril (PRINIVIL,ZESTRIL) 2.5 MG tablet Take 1 tablet (2.5 mg total) by mouth daily.  30 tablet  6  . niacin (NIASPAN) 1000 MG CR tablet Take 1,000 mg by mouth at bedtime.        . nitroGLYCERIN (NITROSTAT) 0.4 MG SL tablet Place 0.4 mg under the tongue every 5 (five) minutes as needed.        Marland Kitchen omeprazole (PRILOSEC) 20 MG capsule Take 20 mg by mouth daily.      Marland Kitchen PARoxetine (PAXIL-CR) 25 MG 24 hr tablet Take 1 tablet (25 mg total) by mouth every morning.  90 tablet  3  . pravastatin (PRAVACHOL) 10 MG tablet Take 10 mg by mouth daily.      . predniSONE (DELTASONE) 20 MG tablet Take 1 tablet (20 mg total) by mouth daily.  90 tablet  2  . sulfamethoxazole-trimethoprim (BACTRIM DS) 800-160 MG per tablet Take 1 tablet by mouth every Monday, Wednesday, and Friday. Take one tablet 3 times weekly.  40 tablet  2  . tiotropium (SPIRIVA) 18 MCG inhalation capsule Place 18 mcg into inhaler and inhale daily.        . valsartan (DIOVAN) 80 MG tablet Take 40 mg by mouth daily.      Marland Kitchen DISCONTD: predniSONE (DELTASONE) 20 MG tablet Take 1 tablet (20 mg total) by mouth daily.  90 tablet  2  . levofloxacin (LEVAQUIN) 500 MG tablet Take 1 tablet (500 mg total) by mouth daily.  7 tablet  0

## 2011-10-23 NOTE — Assessment & Plan Note (Signed)
He likely has a mild exacerbation right now given his chest congestion and cough.    Plan: -levaquin po daily for 7 days -continue advair, and spiriva and albuterol -consider stopping spiriva on next visit (not sure it is adding much) -walk today to make sure he doesn't need oxygen -see tobacco abuse

## 2011-12-28 ENCOUNTER — Encounter: Payer: Self-pay | Admitting: Internal Medicine

## 2011-12-28 ENCOUNTER — Ambulatory Visit: Payer: Medicare Other

## 2011-12-28 ENCOUNTER — Ambulatory Visit (INDEPENDENT_AMBULATORY_CARE_PROVIDER_SITE_OTHER): Payer: Medicare Other | Admitting: Internal Medicine

## 2011-12-28 ENCOUNTER — Ambulatory Visit (INDEPENDENT_AMBULATORY_CARE_PROVIDER_SITE_OTHER)
Admission: RE | Admit: 2011-12-28 | Discharge: 2011-12-28 | Disposition: A | Discharge status: Home / Self Care | Payer: Medicare Other | Source: Ambulatory Visit | Attending: Internal Medicine | Admitting: Internal Medicine

## 2011-12-28 VITALS — BP 124/76 | HR 97 | Temp 98.3°F | Resp 12 | Ht 71.0 in | Wt 171.0 lb

## 2011-12-28 DIAGNOSIS — F329 Major depressive disorder, single episode, unspecified: Secondary | ICD-10-CM

## 2011-12-28 DIAGNOSIS — Z23 Encounter for immunization: Secondary | ICD-10-CM

## 2011-12-28 DIAGNOSIS — R062 Wheezing: Secondary | ICD-10-CM

## 2011-12-28 DIAGNOSIS — F17200 Nicotine dependence, unspecified, uncomplicated: Secondary | ICD-10-CM

## 2011-12-28 DIAGNOSIS — Z79899 Other long term (current) drug therapy: Secondary | ICD-10-CM

## 2011-12-28 DIAGNOSIS — E785 Hyperlipidemia, unspecified: Secondary | ICD-10-CM

## 2011-12-28 DIAGNOSIS — J449 Chronic obstructive pulmonary disease, unspecified: Secondary | ICD-10-CM

## 2011-12-28 DIAGNOSIS — I2510 Atherosclerotic heart disease of native coronary artery without angina pectoris: Secondary | ICD-10-CM

## 2011-12-28 DIAGNOSIS — R5383 Other fatigue: Secondary | ICD-10-CM

## 2011-12-28 DIAGNOSIS — R5381 Other malaise: Secondary | ICD-10-CM

## 2011-12-28 LAB — LIPID PANEL
HDL: 32.1 mg/dL — ABNORMAL LOW (ref >39.00)
Triglycerides: 341 mg/dL — ABNORMAL HIGH (ref 0.0–149.0)
VLDL: 68.2 mg/dL — ABNORMAL HIGH (ref 0.0–40.0)

## 2011-12-28 LAB — CBC WITH DIFFERENTIAL/PLATELET
Basophils Absolute: 0.1 10*3/uL (ref 0.0–0.1)
Basophils Relative: 0.8 % (ref 0.0–3.0)
Eosinophils Absolute: 0.3 10*3/uL (ref 0.0–0.7)
MCHC: 31.2 g/dL (ref 30.0–36.0)
MCV: 83 fl (ref 78.0–100.0)
Monocytes Absolute: 0.7 10*3/uL (ref 0.1–1.0)
Neutrophils Relative %: 58 % (ref 43.0–77.0)
Platelets: 376 10*3/uL (ref 150.0–400.0)
RDW: 17.5 % — ABNORMAL HIGH (ref 11.5–14.6)

## 2011-12-28 LAB — COMPREHENSIVE METABOLIC PANEL
ALT: 11 U/L (ref 0–53)
AST: 10 U/L (ref 0–37)
Albumin: 3.5 g/dL (ref 3.5–5.2)
Alkaline Phosphatase: 124 U/L — ABNORMAL HIGH (ref 39–117)
BUN: 15 mg/dL (ref 6–23)
Potassium: 4.6 mEq/L (ref 3.5–5.1)
Sodium: 135 mEq/L (ref 135–145)

## 2011-12-28 LAB — LDL CHOLESTEROL, DIRECT: Direct LDL: 106.1 mg/dL

## 2011-12-28 MED ORDER — SERTRALINE HCL 100 MG PO TABS: 100.0000 mg | ORAL_TABLET | Freq: Every day | ORAL | Status: DC

## 2011-12-28 MED ORDER — HYDROXYZINE PAMOATE 50 MG PO CAPS: 50.0000 mg | ORAL_CAPSULE | Freq: Three times a day (TID) | ORAL | Status: DC | PRN

## 2011-12-28 NOTE — Patient Instructions (Addendum)
You can switch from Paxil to Sertraline directly.  No taper required  Continue the wellbutrin  Return in one month for follow up on anxiety    Try reducing your cigarette consumption by one cigarette daily every week   We will do fasting  labs today   Please go get a chest x ray today or tomorrow at [facility]

## 2011-12-28 NOTE — Progress Notes (Addendum)
Patient ID: Dennis Barajas, male   DOB: 1960-07-31, 51 y.o.   MRN: [id number]  Patient Active Problem List  Diagnosis  . TOBACCO ABUSE  . DEPRESSIVE DISORDER NOT ELSEWHERE CLASSIFIED  . THUMB PAIN, RIGHT  . CAD (coronary artery disease)  . Hyperlipidemia LDL goal <70  . DIP and RBILD  . COPD (chronic obstructive pulmonary disease)  . Chronic rhinitis  . Right inguinal pain  . Obstructive sleep apnea    Subjective:  CC:   Chief Complaint  Patient presents with  . Pneumonia    HPI:   Dennis Barajas a 51 y.o. male who presents for follow up on chronic conditions including COPD, CAD, anxiety, and tobacco abuse.  He is requesting a refill on hydroxzyzine which he takes in addition to his wellbutrin and paxil for management of anxiety.  He is accompanied by his wife who notes that his anxiety has been increasing and she is asking for a change in medication,  Patient states that his anxiety is due to his health problems that worry him constantly; however he continues to smoke despite having COPD and Diffuse interstitial pneumonia. Still smoking ten cigarettes daily.  has no formal plan for quitting.  His wife also smokes and the odor in the room is overwhelming. He has tried the nicotine patch but states that he was advised by  His cardiologist to avoid it and was advised by his pulmonologist to avoid the E cigarette,  2) Productive cough and dyspnea has been more pronounced over the last 10 days. Denies fevers .  Using his LABA and other inhalers as directed.  He has an appt with mcquaid  Tomorrow,  Has noted increased sputum, no hemoptysis or chest pain .    Past Medical History  Diagnosis Date  . COPD (chronic obstructive pulmonary disease)   . OSA (obstructive sleep apnea)   . Smoker   . Acute sinusitis   . Anxiety   . Hyperlipidemia   . Memory loss   . CAD (coronary artery disease) 2009    stent placement @ [facility]    Past Surgical History  Procedure Date  . Cholecystectomy   .  Appendectomy   . Knee surgery     bilateral knee surgery  . Cardiac catheterization 2009    stent placed @ [facility]          The following portions of the patient's history were reviewed and updated as appropriate: Allergies, current medications, and problem list.    Review of Systems:   12 Pt  review of systems was negative except those addressed in the HPI,     History   Social History  . Marital Status: Married    Spouse Name: N/A    Number of Children: N/A  . Years of Education: N/A   Occupational History  . Not on file.   Social History Main Topics  . Smoking status: Current Every Day Smoker -- 1.0 packs/day for 32 years    Types: Cigarettes  . Smokeless tobacco: Never Used  . Alcohol Use: No  . Drug Use: No  . Sexually Active: Not on file   Other Topics Concern  . Not on file   Social History Narrative  . No narrative on file    Objective:  BP 124/76  Pulse 97  Temp 98.3 F (36.8 C) (Oral)  Resp 12  Ht 5\' 11"  (1.803 m)  Wt 171 lb (77.565 kg)  BMI 23.85 kg/m2  SpO2 94%  General appearance:  alert, cooperative and appears stated age Ears: normal TM's and external ear canals both ears Throat: lips, mucosa, and tongue normal; teeth and gums normal Neck: no adenopathy, no carotid bruit, supple, symmetrical, trachea midline and thyroid not enlarged, symmetric, no tenderness/mass/nodules Back: symmetric, no curvature. ROM normal. No CVA tenderness. Lungs: bilateral wheezing and ronchi,  No egophony or prolonged E:I ratio Heart: regular rate and rhythm, S1, S2 normal, no murmur, click, rub or gallop Abdomen: soft, non-tender; bowel sounds normal; no masses,  no organomegaly Pulses: 2+ and symmetric Skin: Skin color, texture, turgor normal. No rashes or lesions Lymph nodes: Cervical, supraclavicular, and axillary nodes normal.  Assessment and Plan:  DEPRESSIVE DISORDER NOT ELSEWHERE CLASSIFIED Continue wellbutrin for any added benefit toward smoking  cessation.  Changing paxil to zoloft for anxiety .  Will avoid use of anxiolytics/sedatives unless absolutely necessary.  COPD (chronic obstructive pulmonary disease) With increased sputum production and cough for the last 7 to 10 days and wheezing on exam.  Since he has appt with pulmonology, will obtain CXR prior to that visit.   TOBACCO ABUSE Spent 3 minutes discussing his need to quit smoking given his medical history.  He raises barriers to all pharmacotherapy for various reasons.  Recommended that he reduce consumption by one cigarette daily per week.  Urged wife to quit with him for support.   Hyperlipidemia LDL goal <70 Managed with statin, niacin and ASA with goal < 70 due to CAD.  Trigs were e > 300 and LDL 106.  Will confirm fasting state and compliance before adjusting medications .   Updated Medication List Outpatient Encounter Prescriptions as of 12/28/2011  Medication Sig Dispense Refill  . albuterol (PROAIR HFA) 108 (90 BASE) MCG/ACT inhaler Inhale 2 puffs into the lungs every 6 (six) hours as needed.  3 Inhaler  3  . albuterol (PROVENTIL) (2.5 MG/3ML) 0.083% nebulizer solution Take 2.5 mg by nebulization every 6 (six) hours as needed.        Marland Kitchen aspirin 81 MG tablet Take 81 mg by mouth daily.        Marland Kitchen buPROPion (WELLBUTRIN XL) 150 MG 24 hr tablet Take 2 tablets (300 mg total) by mouth daily. Take two tablets daily.  180 tablet  3  . fluticasone (FLONASE) 50 MCG/ACT nasal spray Place 2 sprays into the nose 2 (two) times daily.      . fluticasone-salmeterol (ADVAIR HFA) 115-21 MCG/ACT inhaler Inhale 2 puffs into the lungs 2 (two) times daily.  3 Inhaler  3  . hydrOXYzine (VISTARIL) 50 MG capsule Take 1 capsule (50 mg total) by mouth 3 (three) times daily as needed.  90 capsule  11  . lisinopril (PRINIVIL,ZESTRIL) 2.5 MG tablet Take 1 tablet (2.5 mg total) by mouth daily.  30 tablet  6  . niacin (NIASPAN) 1000 MG CR tablet Take 1,000 mg by mouth at bedtime.        . nitroGLYCERIN  (NITROSTAT) 0.4 MG SL tablet Place 0.4 mg under the tongue every 5 (five) minutes as needed.        Marland Kitchen omeprazole (PRILOSEC) 20 MG capsule Take 20 mg by mouth daily.      . pravastatin (PRAVACHOL) 10 MG tablet Take 10 mg by mouth daily.      . predniSONE (DELTASONE) 20 MG tablet Take 1 tablet (20 mg total) by mouth daily.  90 tablet  2  . sulfamethoxazole-trimethoprim (BACTRIM DS) 800-160 MG per tablet Take 1 tablet by mouth every Monday, Wednesday, and Friday.  Take one tablet 3 times weekly.  40 tablet  2  . tiotropium (SPIRIVA) 18 MCG inhalation capsule Place 18 mcg into inhaler and inhale daily.        . valsartan (DIOVAN) 80 MG tablet Take 40 mg by mouth daily.      . [DISCONTINUED] hydrOXYzine (VISTARIL) 50 MG capsule Take 50 mg by mouth 3 (three) times daily as needed.        . [DISCONTINUED] PARoxetine (PAXIL-CR) 25 MG 24 hr tablet Take 1 tablet (25 mg total) by mouth every morning.  90 tablet  3  . sertraline (ZOLOFT) 100 MG tablet Take 1 tablet (100 mg total) by mouth daily.  30 tablet  3     Orders Placed This Encounter  Procedures  . DG Chest 2 View  . Tdap vaccine greater than or equal to 7yo IM  . Lipid panel  . CBC with Differential  . Comprehensive metabolic panel  . TSH  . LDL cholesterol, direct    Return in about 4 weeks (around 01/25/2012).

## 2011-12-29 ENCOUNTER — Encounter: Payer: Self-pay | Admitting: Internal Medicine

## 2011-12-29 ENCOUNTER — Encounter: Payer: Self-pay | Admitting: Pulmonary Disease

## 2011-12-29 ENCOUNTER — Ambulatory Visit (INDEPENDENT_AMBULATORY_CARE_PROVIDER_SITE_OTHER): Payer: Medicare Other | Admitting: Pulmonary Disease

## 2011-12-29 VITALS — BP 110/78 | HR 78 | Temp 97.0°F | Ht 71.0 in | Wt 169.0 lb

## 2011-12-29 DIAGNOSIS — J40 Bronchitis, not specified as acute or chronic: Secondary | ICD-10-CM | POA: Insufficient documentation

## 2011-12-29 DIAGNOSIS — F17200 Nicotine dependence, unspecified, uncomplicated: Secondary | ICD-10-CM

## 2011-12-29 DIAGNOSIS — J8410 Pulmonary fibrosis, unspecified: Secondary | ICD-10-CM

## 2011-12-29 MED ORDER — PREDNISONE 20 MG PO TABS: ORAL_TABLET | ORAL | Status: DC

## 2011-12-29 MED ORDER — LEVOFLOXACIN 500 MG PO TABS: 500.0000 mg | ORAL_TABLET | Freq: Every day | ORAL | Status: AC

## 2011-12-29 NOTE — Assessment & Plan Note (Addendum)
With increased sputum production and cough for the last 7 to 10 days and wheezing on exam.  Since he has appt with pulmonology, will obtain CXR prior to that visit.

## 2011-12-29 NOTE — Patient Instructions (Signed)
Quit smoking Take the prednisone at 40mg  daily for two weeks, then 20mg  daily for two weeks Take the Levaquin for a week  We will see you back in January

## 2011-12-29 NOTE — Assessment & Plan Note (Signed)
Spent 3 minutes discussing his need to quit smoking given his medical history.  He raises barriers to all pharmacotherapy for various reasons.  Recommended that he reduce consumption by one cigarette daily per week.  Urged wife to quit with him for support.

## 2011-12-29 NOTE — Assessment & Plan Note (Signed)
Educated at length today to stop smoking.  Continue Chantix, e-cigarette OK if used short term.  Would want to clear nicotine patches with Dr. Mariah Milling first given his CAD.

## 2011-12-29 NOTE — Progress Notes (Signed)
Subjective:    Patient ID: Dennis Barajas, male    DOB: March 16, 1960, 51 y.o.   MRN: [id number]  Synopsis: Navy Belay first saw the [facility] pulmonary clinic in may of 2013 for shortness of breath. In 2009 he underwent an open lung biopsy at [facility] which showed DIP and RB ILD. He was also diagnosed with COPD and in 2011 his FEV1 was 53% predicted. He continues to use cigarettes on a regular basis. He previously was exposed to acetic acid when working at a [employer].  HPI   10/23/11 ROV -- Armando continues to smoke one half pack to one full pack of cigarettes daily. He states that he's been having a lot more chest congestion and cough and is getting up thick yellow sputum. He denies fevers or chills. He says he does not have frank chest pain but he does have some pain in his ribs after lengthy frequent coughing spells. This is also been associated with a slight increase in shortness of breath. Overall he says he's been feeling worse with more malaise in the last 2 months. He is still taking all of his medications as written. This includes prednisone, Bactrim, Spiriva, Advair, and albuterol. He has questions today about how to use his albuterol appropriately.  12/29/2011 ROV -- Deldrick returns to clinic today with about 2 weeks of cough productive of clear to green sputum, chest tightness, and shortness of breath. He states that when he was taking the higher dose of prednisone his symptoms went away and he was able to get outside and exercise more. However in the last two weeks the above mentioned symptoms have started back.  He has not been taking any thing for it.  He has not had fevers or chills.  He continues to take his prednisone, inhalers, and bactrim.  He is still smoking somewhere between 1/2 pack to 1 pack of cigarettes daily.   Past Medical History  Diagnosis Date  . COPD (chronic obstructive pulmonary disease)   . OSA (obstructive sleep apnea)   . Smoker   . Acute  sinusitis   . Anxiety   . Hyperlipidemia   . Memory loss   . CAD (coronary artery disease) 2009    stent placement @ [facility]      Review of Systems  Constitutional: Positive for fatigue. Negative for fever, chills and diaphoresis.  HENT: Negative for congestion, rhinorrhea and postnasal drip.   Respiratory: Positive for cough and shortness of breath. Negative for chest tightness and wheezing.   Cardiovascular: Negative for chest pain and leg swelling.       Objective:   Physical Exam   Filed Vitals:   12/29/11 1619  BP: 110/78  Pulse: 78  Temp: 97 F (36.1 C)  TempSrc: Oral  Height: 5\' 11"  (1.803 m)  Weight: 169 lb (76.658 kg)  SpO2: 95%   Gen: chronically ill appearing, moon facies, no acute distress HEENT: NCAT, PERRL, EOMi, OP clear, neck supple without masses PULM: CTA B CV: crackles worse in bases R > L, no wheezing today AB: BS+, soft, nontender, no hsm Ext: warm, no edema, no clubbing, no cyanosis        Assessment & Plan:   DIP and RBILD I believe this is progressing based on his symptoms and is currently associated with tracheobronchitis.  It is due to his ongoing tobacco use.  He responds to prednisone, but it makes no sense to ramp up immunosuppressant therapy until he quits smoking as this  may cure the disease.  We need some objective data to follow.  Plan: -increase prednisone for a month again -Levaquin for tracheobronchitis -consulted a length today on quitting smoking. This is really the only intervention that will help. -if he quits and continues to decline (change in PFT's, ), then will add a second immunosuppressant such as methotrexate, imuran, or cellcept -f/u in one month  TOBACCO ABUSE Educated at length today to stop smoking.  Continue Chantix, e-cigarette OK if used short term.  Would want to clear nicotine patches with Dr. Mariah Milling first given his CAD.  Tracheobronchitis CXR without acute change yesterday, so no clear  pneumonia  Plan: -Levaquin    Updated Medication List Outpatient Encounter Prescriptions as of 12/29/2011  Medication Sig Dispense Refill  . albuterol (PROAIR HFA) 108 (90 BASE) MCG/ACT inhaler Inhale 2 puffs into the lungs every 6 (six) hours as needed.  3 Inhaler  3  . albuterol (PROVENTIL) (2.5 MG/3ML) 0.083% nebulizer solution Take 2.5 mg by nebulization every 6 (six) hours as needed.        Marland Kitchen aspirin 81 MG tablet Take 81 mg by mouth daily.        Marland Kitchen buPROPion (WELLBUTRIN XL) 150 MG 24 hr tablet Take 2 tablets (300 mg total) by mouth daily. Take two tablets daily.  180 tablet  3  . fluticasone (FLONASE) 50 MCG/ACT nasal spray Place 2 sprays into the nose 2 (two) times daily.      . fluticasone-salmeterol (ADVAIR HFA) 115-21 MCG/ACT inhaler Inhale 2 puffs into the lungs 2 (two) times daily.  3 Inhaler  3  . hydrOXYzine (VISTARIL) 50 MG capsule Take 1 capsule (50 mg total) by mouth 3 (three) times daily as needed.  90 capsule  11  . lisinopril (PRINIVIL,ZESTRIL) 2.5 MG tablet Take 1 tablet (2.5 mg total) by mouth daily.  30 tablet  6  . niacin (NIASPAN) 1000 MG CR tablet Take 1,000 mg by mouth at bedtime.        . nitroGLYCERIN (NITROSTAT) 0.4 MG SL tablet Place 0.4 mg under the tongue every 5 (five) minutes as needed.        Marland Kitchen omeprazole (PRILOSEC) 20 MG capsule Take 20 mg by mouth daily.      . pravastatin (PRAVACHOL) 10 MG tablet Take 10 mg by mouth daily.      . predniSONE (DELTASONE) 20 MG tablet Take 1 tablet (20 mg total) by mouth daily.  90 tablet  2  . sertraline (ZOLOFT) 100 MG tablet Take 1 tablet (100 mg total) by mouth daily.  30 tablet  3  . sulfamethoxazole-trimethoprim (BACTRIM DS) 800-160 MG per tablet Take 1 tablet by mouth every Monday, Wednesday, and Friday. Take one tablet 3 times weekly.  40 tablet  2  . tiotropium (SPIRIVA) 18 MCG inhalation capsule Place 18 mcg into inhaler and inhale daily.        . valsartan (DIOVAN) 80 MG tablet Take 40 mg by mouth daily.

## 2011-12-29 NOTE — Assessment & Plan Note (Signed)
I believe this is progressing based on his symptoms and is currently associated with tracheobronchitis.  It is due to his ongoing tobacco use.  He responds to prednisone, but it makes no sense to ramp up immunosuppressant therapy until he quits smoking as this may cure the disease.  We need some objective data to follow.  Plan: -increase prednisone for a month again -Levaquin for tracheobronchitis -consulted a length today on quitting smoking. This is really the only intervention that will help. -if he quits and continues to decline (change in PFT's, ), then will add a second immunosuppressant such as methotrexate, imuran, or cellcept -f/u in one month

## 2011-12-29 NOTE — Assessment & Plan Note (Signed)
Continue wellbutrin for any added benefit toward smoking cessation.  Changing paxil to zoloft for anxiety .  Will avoid use of anxiolytics/sedatives unless absolutely necessary.

## 2011-12-29 NOTE — Assessment & Plan Note (Addendum)
Managed with statin, niacin and ASA with goal < 70 due to CAD.  Trigs were e > 300 and LDL 106.  Will confirm fasting state and compliance before adjusting medications .

## 2011-12-29 NOTE — Assessment & Plan Note (Signed)
CXR without acute change yesterday, so no clear pneumonia  Plan: -Levaquin

## 2011-12-30 MED ORDER — PRAVASTATIN SODIUM 40 MG PO TABS: 40.0000 mg | ORAL_TABLET | Freq: Every day | ORAL | Status: DC

## 2011-12-30 NOTE — Addendum Note (Signed)
Addended by: Sherlene Shams on: 12/30/2011 01:14 PM   Modules accepted: Orders

## 2012-01-27 ENCOUNTER — Ambulatory Visit: Payer: Medicare Other | Admitting: Internal Medicine

## 2012-02-02 ENCOUNTER — Ambulatory Visit: Payer: Medicare Other | Admitting: Pulmonary Disease

## 2012-02-04 NOTE — Progress Notes (Signed)
No show

## 2012-06-19 ENCOUNTER — Other Ambulatory Visit: Payer: Self-pay | Admitting: Cardiovascular Disease

## 2012-06-20 ENCOUNTER — Telehealth: Payer: Self-pay

## 2012-06-20 NOTE — Telephone Encounter (Signed)
Patient will come to see Dr. Mariah Milling on July 19, 2012.

## 2012-06-20 NOTE — Telephone Encounter (Signed)
LMOM to call office regarding a follow up appointment with Dr. Mariah Milling.

## 2012-07-18 ENCOUNTER — Other Ambulatory Visit: Payer: Self-pay | Admitting: Internal Medicine

## 2012-07-19 ENCOUNTER — Ambulatory Visit: Payer: Medicare Other | Admitting: Cardiovascular Disease

## 2012-07-20 ENCOUNTER — Other Ambulatory Visit: Payer: Self-pay | Admitting: Internal Medicine

## 2012-07-22 ENCOUNTER — Other Ambulatory Visit: Payer: Self-pay | Admitting: Cardiovascular Disease

## 2012-07-25 ENCOUNTER — Other Ambulatory Visit: Payer: Self-pay | Admitting: *Deleted

## 2012-07-25 MED ORDER — LISINOPRIL 2.5 MG PO TABS: ORAL_TABLET | ORAL | Status: DC

## 2012-07-25 NOTE — Telephone Encounter (Signed)
Refilled Lisinopril no refills. Pt has appointment for 08/16/12.

## 2012-08-09 ENCOUNTER — Encounter: Payer: Self-pay | Admitting: Cardiovascular Disease

## 2012-08-09 ENCOUNTER — Ambulatory Visit (INDEPENDENT_AMBULATORY_CARE_PROVIDER_SITE_OTHER): Payer: Self-pay | Admitting: Cardiovascular Disease

## 2012-08-09 VITALS — BP 140/76 | HR 87 | Ht 71.0 in | Wt 177.0 lb

## 2012-08-09 DIAGNOSIS — E785 Hyperlipidemia, unspecified: Secondary | ICD-10-CM

## 2012-08-09 DIAGNOSIS — J449 Chronic obstructive pulmonary disease, unspecified: Secondary | ICD-10-CM

## 2012-08-09 DIAGNOSIS — F17200 Nicotine dependence, unspecified, uncomplicated: Secondary | ICD-10-CM

## 2012-08-09 DIAGNOSIS — F329 Major depressive disorder, single episode, unspecified: Secondary | ICD-10-CM

## 2012-08-09 DIAGNOSIS — I2510 Atherosclerotic heart disease of native coronary artery without angina pectoris: Secondary | ICD-10-CM

## 2012-08-09 MED ORDER — PRAVASTATIN SODIUM 80 MG PO TABS: 80.0000 mg | ORAL_TABLET | Freq: Every day | ORAL | Status: DC

## 2012-08-09 MED ORDER — LISINOPRIL 2.5 MG PO TABS: 2.5000 mg | ORAL_TABLET | Freq: Every day | ORAL | Status: DC

## 2012-08-09 MED ORDER — VALSARTAN 80 MG PO TABS: 80.0000 mg | ORAL_TABLET | Freq: Every day | ORAL | Status: DC

## 2012-08-09 NOTE — Assessment & Plan Note (Signed)
We have encouraged him to continue to work on weaning his cigarettes and smoking cessation. He will continue to work on this and does not want any assistance with chantix.  

## 2012-08-09 NOTE — Assessment & Plan Note (Signed)
Currently with no symptoms of angina. No further workup at this time. Continue current medication regimen. Encouraged him to stop smoking

## 2012-08-09 NOTE — Assessment & Plan Note (Signed)
Depression and anxiety seems to be a major issue. He appears nervous in the exam room today. Reports having stressors at home. This is making it difficult for him to stop smoking

## 2012-08-09 NOTE — Progress Notes (Signed)
Patient ID: Dennis Barajas, male    DOB: 22-May-1960, 52 y.o.   MRN: [id number]  HPI Comments: Mr. 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  He started smoking when he was 17, total 30 years.  He continues to smoke one pack per day.  He is trying to quit. He now has an electronic cigarette . We had long discussion about the ways that he could quit smoking .  He reports the stress at home has been significant as his 4 daughters are causing significant stress when they call. His wife also smokes.  He reports that anxiety is a big part of his problem. He denies any significant chest pain. Is not on oxygen. He continues on prednisone 20 mg daily.  EKG shows normal sinus rhythm with rate 87 beats per minute with no significant ST or T wave changes   Outpatient Encounter Prescriptions as of 08/09/2012  Medication Sig Dispense Refill  . albuterol (PROVENTIL) (2.5 MG/3ML) 0.083% nebulizer solution Take 2.5 mg by nebulization every 6 (six) hours as needed.        Marland Kitchen aspirin 81 MG tablet Take 81 mg by mouth daily.        Marland Kitchen buPROPion (WELLBUTRIN XL) 150 MG 24 hr tablet TAKE 2 TABLETS (300 MG TOTAL) BY MOUTH DAILY. TAKE TWO TABLETS DAILY.  180 tablet  0  . Diphenhydramine-APAP, sleep, (TYLENOL PM EXTRA STRENGTH PO) Take by mouth as needed.      . ferrous gluconate (FERGON) 325 MG tablet Take 325 mg by mouth daily with breakfast.      . fluticasone (FLONASE) 50 MCG/ACT nasal spray Place 2 sprays into the nose as needed.        . fluticasone-salmeterol (ADVAIR HFA) 115-21 MCG/ACT inhaler Inhale 2 puffs into the lungs as needed.      . hydrOXYzine (VISTARIL) 50 MG capsule Take 1 capsule (50 mg total) by mouth 3 (three) times daily as needed.  90 capsule  11  . lisinopril (PRINIVIL,ZESTRIL) 2.5 MG tablet TAKE 1 TABLET BY MOUTH EVERY DAY  30 tablet  3  . niacin 500 MG tablet Take 500 mg by mouth daily with breakfast.      . nitroGLYCERIN (NITROSTAT) 0.4 MG SL tablet Place 0.4 mg under the tongue every 5 (five) minutes as needed.        Marland Kitchen omeprazole (PRILOSEC) 20 MG capsule Take 20 mg by mouth daily.      Marland Kitchen PARoxetine (PAXIL-CR) 25 MG 24 hr tablet Take 25 mg by mouth daily.      . pravastatin (PRAVACHOL) 40 MG tablet Take 1 tablet (40 mg total) by mouth daily.  90 tablet  3  . predniSONE (DELTASONE) 20 MG tablet Take 1 tablet (20 mg total) by mouth daily.  90 tablet  2  . PROAIR HFA 108 (90 BASE) MCG/ACT inhaler INHALE 2 PUFFS INTO THE LUNGS EVERY 6 HOURS AS NEEDED  25.5 each  2  . sertraline (ZOLOFT) 100 MG tablet Take 1 tablet (100 mg total) by mouth daily.  30 tablet  3  .  sulfamethoxazole-trimethoprim (BACTRIM DS) 800-160 MG per tablet Take 1 tablet by mouth every Monday, Wednesday, and Friday. Take one tablet 3 times weekly.  40 tablet  2  . tiotropium (SPIRIVA) 18 MCG inhalation capsule Place 18 mcg into inhaler and inhale daily.        . valsartan (DIOVAN) 80 MG tablet Take 40 mg by mouth daily.      . [DISCONTINUED] fluticasone-salmeterol (ADVAIR HFA) 115-21 MCG/ACT inhaler Inhale 2 puffs into the lungs 2 (two) times daily.  3 Inhaler  3  . [DISCONTINUED] lisinopril (PRINIVIL,ZESTRIL) 2.5 MG tablet TAKE 1 TABLET BY MOUTH EVERY DAY  30 tablet  0  . [DISCONTINUED] niacin (NIASPAN) 1000 MG CR tablet Take 1,000 mg by mouth at bedtime.        . [DISCONTINUED] predniSONE (DELTASONE) 20 MG tablet Take 40mg  prednisone daily for two weeks, then 30mg  daily for two weeks  60 tablet  0   No facility-administered encounter  medications on file as of 08/09/2012.     Review of Systems  Constitutional: Negative.   HENT: Negative.   Eyes: Negative.   Respiratory: Positive for shortness of breath.   Cardiovascular: Negative.   Gastrointestinal: Negative.   Musculoskeletal: Negative.   Skin: Negative.   Neurological: Negative.   Psychiatric/Behavioral: The patient is nervous/anxious.   All other systems reviewed and are negative.    BP 140/76  Pulse 87  Ht 5\' 11"  (1.803 m)  Wt 177 lb (80.287 kg)  BMI 24.7 kg/m2   Physical Exam  Nursing note and vitals reviewed. Constitutional: He is oriented to person, place, and time. He appears well-developed and well-nourished.  HENT:  Head: Normocephalic.  Nose: Nose normal.  Mouth/Throat: Oropharynx is clear and moist.  Eyes: Conjunctivae are normal. Pupils are equal, round, and reactive to light.  Neck: Normal range of motion. Neck supple. No JVD present.  Cardiovascular: Normal rate, regular rhythm, S1 normal, S2 normal, normal heart sounds and intact distal pulses.  Exam reveals no gallop and no friction rub.   No murmur heard. Pulmonary/Chest: He is in respiratory distress. He has wheezes. He has rales. He exhibits no tenderness.  Diffuse wheezing, rhonchi  Abdominal: Soft. Bowel sounds are normal. He exhibits no distension. There is no tenderness.  Musculoskeletal: Normal range of motion. He exhibits no edema and no tenderness.  Lymphadenopathy:    He has no cervical adenopathy.  Neurological: He is alert and oriented to person, place, and time. Coordination normal.  Skin: Skin is warm and dry. No rash noted. No erythema.  Psychiatric: He has a normal mood and affect. His behavior is normal. Judgment and thought content normal.      Assessment and Plan

## 2012-08-09 NOTE — Assessment & Plan Note (Signed)
He has not seen Dr. Kendrick Fries in many months. We have suggested he schedule a followup. He continues on prednisone 20 mg daily I believe through most of 2014.

## 2012-08-09 NOTE — Patient Instructions (Addendum)
You are doing well. Please increase pravastatin to 80 mg daily  (take 2 of the 40 mg pills until they run out) New script is for 80 mg pill (take one)  Please call us if you have new issues that need to be addressed before your next appt.  Your physician wants you to follow-up in: 6 months.  You will receive a reminder letter in the mail two months in advance. If you don't receive a letter, please call our office to schedule the follow-up appointment.

## 2012-08-14 ENCOUNTER — Other Ambulatory Visit: Payer: Self-pay | Admitting: Internal Medicine

## 2012-08-14 NOTE — Telephone Encounter (Signed)
You have requested refills of your medications which prompted a review of your chart.  You have not seen me in over  6 months and are overdue for follow up   I can refill your medications for one month only until you are seen.  Please make an appt for fasting labs prior to your office visit.  Regards,  Dr. Darrick Huntsman

## 2012-08-16 ENCOUNTER — Ambulatory Visit: Payer: Medicare Other | Admitting: Cardiovascular Disease

## 2012-08-31 ENCOUNTER — Telehealth: Payer: Self-pay | Admitting: Pulmonary Disease

## 2012-08-31 MED ORDER — PREDNISONE 20 MG PO TABS: 20.0000 mg | ORAL_TABLET | Freq: Every day | ORAL | Status: DC

## 2012-08-31 NOTE — Telephone Encounter (Signed)
Called, spoke with pt's wife.  Reports [facility] advised that we denied pred rx.  I do not see this in pt's chart where we declined prednisone.  We have scheduled pt to see BQ on Sept 2 at 4:15 pm in [place].  Pred rx [facility] to [facility] [place].  Wife aware.

## 2012-09-09 ENCOUNTER — Other Ambulatory Visit: Payer: Self-pay | Admitting: *Deleted

## 2012-09-09 MED ORDER — LISINOPRIL 2.5 MG PO TABS: 2.5000 mg | ORAL_TABLET | Freq: Every day | ORAL | Status: DC

## 2012-09-09 NOTE — Telephone Encounter (Signed)
Refilled Lisinopril sent to [facility] pharmacy. 

## 2012-09-12 ENCOUNTER — Other Ambulatory Visit: Payer: Self-pay | Admitting: *Deleted

## 2012-09-12 ENCOUNTER — Other Ambulatory Visit: Payer: Self-pay

## 2012-09-12 MED ORDER — LISINOPRIL 2.5 MG PO TABS: 2.5000 mg | ORAL_TABLET | Freq: Every day | ORAL | Status: DC

## 2012-09-12 NOTE — Telephone Encounter (Signed)
Refill sent for lisinopril  

## 2012-09-13 MED ORDER — PAROXETINE HCL ER 25 MG PO TB24: 25.0000 mg | ORAL_TABLET | Freq: Every day | ORAL | Status: DC

## 2012-09-13 NOTE — Telephone Encounter (Signed)
Okay to refill? Last seen in December & no showed in January

## 2012-09-13 NOTE — Telephone Encounter (Signed)
Refill one 30 days only.  Has not been seen in 6 months so he needs a  Follow up

## 2012-09-20 ENCOUNTER — Encounter: Payer: Self-pay | Admitting: Pulmonary Disease

## 2012-09-20 ENCOUNTER — Ambulatory Visit (INDEPENDENT_AMBULATORY_CARE_PROVIDER_SITE_OTHER): Payer: Medicare Other | Admitting: Pulmonary Disease

## 2012-09-20 VITALS — BP 132/74 | HR 74 | Temp 98.7°F | Ht 71.0 in | Wt 179.0 lb

## 2012-09-20 DIAGNOSIS — J310 Chronic rhinitis: Secondary | ICD-10-CM

## 2012-09-20 DIAGNOSIS — J8410 Pulmonary fibrosis, unspecified: Secondary | ICD-10-CM

## 2012-09-20 DIAGNOSIS — F17200 Nicotine dependence, unspecified, uncomplicated: Secondary | ICD-10-CM

## 2012-09-20 DIAGNOSIS — J449 Chronic obstructive pulmonary disease, unspecified: Secondary | ICD-10-CM

## 2012-09-20 MED ORDER — SULFAMETHOXAZOLE-TMP DS 800-160 MG PO TABS: 1.0000 | ORAL_TABLET | ORAL | Status: DC

## 2012-09-20 MED ORDER — IPRATROPIUM BROMIDE 0.03 % NA SOLN: 2.0000 | Freq: Four times a day (QID) | NASAL | Status: DC

## 2012-09-20 NOTE — Progress Notes (Signed)
Subjective:    Patient ID: Dennis Barajas, male    DOB: 06-22-1960, 52 y.o.   MRN: [id number]  Synopsis: Dennis Barajas first saw the [facility] pulmonary clinic in may of 2013 for shortness of breath. In 2009 he underwent an open lung biopsy at [facility] which showed DIP and RB ILD. He was also diagnosed with COPD and in 2011 his FEV1 was 53% predicted. He continues to use cigarettes on a regular basis. He previously was exposed to acetic acid when working at a [employer].  HPI   10/23/11 ROV -- Saxon continues to smoke one half pack to one full pack of cigarettes daily. He states that he's been having a lot more chest congestion and cough and is getting up thick yellow sputum. He denies fevers or chills. He says he does not have frank chest pain but he does have some pain in his ribs after lengthy frequent coughing spells. This is also been associated with a slight increase in shortness of breath. Overall he says he's been feeling worse with more malaise in the last 2 months. He is still taking all of his medications as written. This includes prednisone, Bactrim, Spiriva, Advair, and albuterol. He has questions today about how to use his albuterol appropriately.  12/29/2011 ROV -- Dennis Barajas returns to clinic today with about 2 weeks of cough productive of clear to green sputum, chest tightness, and shortness of breath. He states that when he was taking the higher dose of prednisone his symptoms went away and he was able to get outside and exercise more. However in the last two weeks the above mentioned symptoms have started back.  He has not been taking any thing for it.  He has not had fevers or chills.  He continues to take his prednisone, inhalers, and bactrim.  He is still smoking somewhere between 1/2 pack to 1 pack of cigarettes daily.  09/20/2012 ROV > Dennis Barajas has been feeling better since the last visit. He has gained a little weight and has remained active. He has not had  pneumonia this year which is a dramatic improvement prior to previous years. He has cut back his cigarettes to one half packs per day. He still has a cough with this clear sputum production. He has clear drainage from his runny nose nearly constantly. He is not using his Flonase anymore. He continues to use prednisone. He hasn't taken Bactrim in 2 months. He has not had any recent fevers or chills.   Past Medical History  Diagnosis Date  . COPD (chronic obstructive pulmonary disease)   . OSA (obstructive sleep apnea)   . Smoker   . Acute sinusitis   . Anxiety   . Hyperlipidemia   . Memory loss   . CAD (coronary artery disease) 2009    stent placement @ [facility]      Review of Systems  Constitutional: Positive for fatigue. Negative for fever, chills and diaphoresis.  HENT: Positive for rhinorrhea. Negative for congestion and postnasal drip.   Respiratory: Positive for cough and shortness of breath. Negative for chest tightness and wheezing.   Cardiovascular: Negative for chest pain and leg swelling.       Objective:   Physical Exam   Filed Vitals:   09/20/12 1630  BP: 132/74  Pulse: 74  Temp: 98.7 F (37.1 C)  TempSrc: Oral  Height: 5\' 11"  (1.803 m)  Weight: 179 lb (81.194 kg)  SpO2: 93%   Gen: good spirits, moon facies, no  acute distress HEENT: NCAT, EOMi, OP clear,  PULM: Insp crackles in bases R> L, wheezing bilaterally; good air movement CV: RRR, no mgr AB: BS+, soft, nontender, no hsm Ext: warm, no edema, no clubbing, no cyanosis        Assessment & Plan:   COPD (chronic obstructive pulmonary disease) Continue Spiriva and Advair Quit smoking, quit smoking, quit smoking Flu shot  DIP and RBILD Dennis Barajas looks better than I have ever seen him and I think that this is directly related to the fact that he has cut back his cigarettes.  If he could quit smoking altogether it is possible that he could come off of steroids all together.  Frustratingly, he continues to  smoke.  Plan: -continue prednisone -quit smoking -restart bactrim for PCP prophylaxis -f/u 6 months  TOBACCO ABUSE Continue e-cigarette, nicotine patches. Discussed AGAIN at length the need to quit at his lung diseases are SOLELY DUE TO CIGARETTE SMOKING.  Chronic rhinitis Add ipratropium prn nasal spray    Updated Medication List Outpatient Encounter Prescriptions as of 09/20/2012  Medication Sig Dispense Refill  . albuterol (PROVENTIL) (2.5 MG/3ML) 0.083% nebulizer solution Take 2.5 mg by nebulization every 6 (six) hours as needed.        Marland Kitchen aspirin 81 MG tablet Take 81 mg by mouth daily.        Marland Kitchen buPROPion (WELLBUTRIN XL) 150 MG 24 hr tablet TAKE 2 TABLETS (300 MG TOTAL) BY MOUTH DAILY. TAKE TWO TABLETS DAILY.  180 tablet  0  . Diphenhydramine-APAP, sleep, (TYLENOL PM EXTRA STRENGTH PO) Take by mouth as needed.      . ferrous gluconate (FERGON) 325 MG tablet Take 325 mg by mouth daily with breakfast.      . fluticasone (FLONASE) 50 MCG/ACT nasal spray Place 2 sprays into the nose as needed.       . fluticasone-salmeterol (ADVAIR HFA) 115-21 MCG/ACT inhaler Inhale 2 puffs into the lungs as needed.      . hydrOXYzine (VISTARIL) 50 MG capsule Take 1 capsule (50 mg total) by mouth 3 (three) times daily as needed.  90 capsule  11  . lisinopril (PRINIVIL,ZESTRIL) 2.5 MG tablet Take 1 tablet (2.5 mg total) by mouth daily.  90 tablet  3  . niacin 500 MG tablet Take 500 mg by mouth daily with breakfast.      . nitroGLYCERIN (NITROSTAT) 0.4 MG SL tablet Place 0.4 mg under the tongue every 5 (five) minutes as needed.        Marland Kitchen omeprazole (PRILOSEC) 20 MG capsule Take 20 mg by mouth daily.      Marland Kitchen PARoxetine (PAXIL-CR) 25 MG 24 hr tablet TAKE 1 TABLET (25 MG TOTAL) BY MOUTH EVERY MORNING.  30 tablet  0  . pravastatin (PRAVACHOL) 80 MG tablet Take 1 tablet (80 mg total) by mouth daily.  90 tablet  3  . predniSONE (DELTASONE) 20 MG tablet Take 1 tablet (20 mg total) by mouth daily.  30 tablet  0   . PROAIR HFA 108 (90 BASE) MCG/ACT inhaler INHALE 2 PUFFS INTO THE LUNGS EVERY 6 HOURS AS NEEDED  25.5 each  2  . sulfamethoxazole-trimethoprim (BACTRIM DS) 800-160 MG per tablet Take 1 tablet by mouth every Monday, Wednesday, and Friday. Take one tablet 3 times weekly.  40 tablet  2  . tiotropium (SPIRIVA) 18 MCG inhalation capsule Place 18 mcg into inhaler and inhale daily.        . valsartan (DIOVAN) 80 MG tablet Take 1  tablet (80 mg total) by mouth daily.  90 tablet  3  . [DISCONTINUED] PARoxetine (PAXIL-CR) 25 MG 24 hr tablet Take 1 tablet (25 mg total) by mouth daily.  30 tablet  0  . [DISCONTINUED] sertraline (ZOLOFT) 100 MG tablet Take 1 tablet (100 mg total) by mouth daily.  30 tablet  3   No facility-administered encounter medications on file as of 09/20/2012.

## 2012-09-20 NOTE — Assessment & Plan Note (Addendum)
Dennis Barajas looks better than I have ever seen him and I think that this is directly related to the fact that he has cut back his cigarettes.  If he could quit smoking altogether it is possible that he could come off of steroids all together.  Frustratingly, he continues to smoke.  Plan: -continue prednisone -quit smoking -restart bactrim for PCP prophylaxis -f/u 6 months

## 2012-09-20 NOTE — Assessment & Plan Note (Signed)
Continue e-cigarette, nicotine patches. Discussed AGAIN at length the need to quit at his lung diseases are SOLELY DUE TO CIGARETTE SMOKING.

## 2012-09-20 NOTE — Assessment & Plan Note (Signed)
Continue Spiriva and Advair Quit smoking, quit smoking, quit smoking Flu shot

## 2012-09-20 NOTE — Patient Instructions (Signed)
Quit smoking, quit smoking, quit smoking  Take bactrim every MWF  Keep taking your other inhalers and prednisone as you are doing  Get a flu shot ASAP  We will see you back in 6 months or sooner if needed

## 2012-09-21 ENCOUNTER — Encounter: Payer: Self-pay | Admitting: *Deleted

## 2012-09-21 NOTE — Assessment & Plan Note (Signed)
Add ipratropium prn nasal spray

## 2012-09-21 NOTE — Telephone Encounter (Signed)
Mailed letter for pt to make an appt

## 2012-10-10 ENCOUNTER — Other Ambulatory Visit: Payer: Self-pay | Admitting: Internal Medicine

## 2012-10-13 ENCOUNTER — Other Ambulatory Visit: Payer: Self-pay | Admitting: *Deleted

## 2012-10-13 MED ORDER — PREDNISONE 20 MG PO TABS: 20.0000 mg | ORAL_TABLET | Freq: Every day | ORAL | Status: DC

## 2012-11-05 ENCOUNTER — Other Ambulatory Visit: Payer: Self-pay | Admitting: Internal Medicine

## 2012-11-19 ENCOUNTER — Other Ambulatory Visit: Payer: Self-pay | Admitting: Internal Medicine

## 2012-11-21 NOTE — Telephone Encounter (Signed)
Pt's wife notified. Will call back to schedule appointments.

## 2012-11-21 NOTE — Telephone Encounter (Signed)
Refill wellbutrin for 30 days only. Needs to be seen,  Needs fASTING LABS PRIOR TO VISIT AS ORDERED LAST YEAR BUT NEVER DONE

## 2012-11-21 NOTE — Telephone Encounter (Signed)
Last visit 12/28/11, refill?

## 2013-02-08 ENCOUNTER — Other Ambulatory Visit: Payer: Self-pay

## 2013-02-08 MED ORDER — PREDNISONE 20 MG PO TABS: 20.0000 mg | ORAL_TABLET | Freq: Every day | ORAL | Status: DC

## 2013-03-08 ENCOUNTER — Telehealth: Payer: Self-pay | Admitting: Internal Medicine

## 2013-03-08 NOTE — Telephone Encounter (Signed)
Pt left msg on vm needing to schedule appt.  Returned call, left vm asking pt to call us to schedule the appt.

## 2013-03-17 ENCOUNTER — Ambulatory Visit: Payer: Medicare Other | Admitting: Pulmonary Disease

## 2013-03-20 ENCOUNTER — Encounter: Payer: Self-pay | Admitting: Cardiovascular Disease

## 2013-03-20 ENCOUNTER — Ambulatory Visit (INDEPENDENT_AMBULATORY_CARE_PROVIDER_SITE_OTHER): Payer: Commercial Managed Care - HMO | Admitting: Cardiovascular Disease

## 2013-03-20 VITALS — BP 120/78 | HR 81 | Ht 71.0 in | Wt 176.0 lb

## 2013-03-20 DIAGNOSIS — I2510 Atherosclerotic heart disease of native coronary artery without angina pectoris: Secondary | ICD-10-CM

## 2013-03-20 DIAGNOSIS — F17200 Nicotine dependence, unspecified, uncomplicated: Secondary | ICD-10-CM

## 2013-03-20 DIAGNOSIS — R0602 Shortness of breath: Secondary | ICD-10-CM

## 2013-03-20 DIAGNOSIS — E785 Hyperlipidemia, unspecified: Secondary | ICD-10-CM | POA: Diagnosis not present

## 2013-03-20 NOTE — Assessment & Plan Note (Signed)
Currently with no symptoms of angina. No further workup at this time. Continue current medication regimen. 

## 2013-03-20 NOTE — Patient Instructions (Signed)
You are doing well. No medication changes were made.  Please call us if you have new issues that need to be addressed before your next appt.  Your physician wants you to follow-up in: 6 months.  You will receive a reminder letter in the mail two months in advance. If you don't receive a letter, please call our office to schedule the follow-up appointment.   

## 2013-03-20 NOTE — Progress Notes (Signed)
Patient ID: Dennis RankinsJeffrey Barajas, male    DOB: 1960-12-17, 53 y.o.   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 He has had recent COPD exacerbation late last year, still on prednisone 20 mg daily. Reports having chronic mucous. No green or yellow, only thick white secretions. He has shortness of breath with heavy exertion. Has to pace himself. He does not want oxygen  He reports that anxiety is a big part of his problem. He denies any significant chest pain.  He continues on prednisone 20 mg daily.  EKG shows normal sinus rhythm with rate 81 beats per minute with nonspecific ST abnormality in lead 3 and aVF   Outpatient Encounter Prescriptions as of 03/20/2013  Medication Sig  . albuterol (PROVENTIL) (2.5 MG/3ML) 0.083% nebulizer solution Take 2.5 mg by nebulization every 6 (six) hours as needed.    Marland Kitchen. aspirin 81 MG tablet Take 81 mg by mouth daily.    Marland Kitchen. buPROPion (WELLBUTRIN XL) 150 MG 24 hr tablet TAKE 2 TABLETS BY MOUTH EVERY DAY  . Diphenhydramine-APAP, sleep, (TYLENOL PM EXTRA STRENGTH PO) Take by mouth as needed.  . ferrous gluconate (FERGON) 325 MG tablet Take 325 mg by mouth daily with breakfast.  . fluticasone (FLONASE) 50 MCG/ACT nasal spray Place 2 sprays into the nose as needed.   . fluticasone-salmeterol (ADVAIR HFA) 115-21 MCG/ACT inhaler Inhale 2 puffs  into the lungs as needed.  . hydrOXYzine (VISTARIL) 50 MG capsule Take 1 capsule (50 mg total) by mouth 3 (three) times daily as needed.  Marland Kitchen. ipratropium (ATROVENT) 0.03 % nasal spray Place 2 sprays into the nose 4 (four) times daily.  Marland Kitchen. lisinopril (PRINIVIL,ZESTRIL) 2.5 MG tablet Take 1 tablet (2.5 mg total) by mouth daily.  . niacin 500 MG tablet Take 500 mg by mouth daily with breakfast.  . nitroGLYCERIN (NITROSTAT) 0.4 MG SL tablet Place 0.4 mg under the tongue every 5 (five) minutes as needed.    Marland Kitchen. omeprazole (PRILOSEC) 20 MG capsule Take 20 mg by mouth daily.  Marland Kitchen. PARoxetine (PAXIL-CR) 25 MG 24 hr tablet TAKE 1 TABLET (25 MG TOTAL) BY MOUTH EVERY MORNING.  . pravastatin (PRAVACHOL) 80 MG tablet Take 1 tablet (80 mg total) by mouth daily.  . predniSONE (DELTASONE) 20 MG tablet Take 1 tablet (20 mg total) by mouth daily.  Marland Kitchen. PROAIR HFA 108 (90 BASE) MCG/ACT inhaler INHALE 2 PUFFS INTO THE LUNGS EVERY 6 HOURS AS NEEDED  . sulfamethoxazole-trimethoprim (BACTRIM DS) 800-160 MG per tablet Take 1 tablet by mouth every Monday, Wednesday, and Friday. Take one tablet 3 times weekly.  Marland Kitchen. tiotropium (SPIRIVA) 18 MCG inhalation capsule Place 18 mcg into inhaler and inhale daily.    . valsartan (DIOVAN) 80 MG tablet Take 1 tablet (80 mg total) by mouth daily.     Review of Systems  Constitutional: Negative.   HENT: Negative.   Eyes: Negative.   Respiratory: Positive for shortness  of breath.   Cardiovascular: Negative.   Gastrointestinal: Negative.   Endocrine: Negative.   Musculoskeletal: Negative.   Skin: Negative.   Allergic/Immunologic: Negative.   Neurological: Negative.   Hematological: Negative.   Psychiatric/Behavioral: The patient is nervous/anxious.   All other systems reviewed and are negative.    BP 120/78  Pulse 81  Ht 5\' 11"  (1.803 m)  Wt 176 lb (79.833 kg)  BMI 24.56 kg/m2   Physical Exam  Nursing note and vitals reviewed. Constitutional: He is oriented to person, place,  and time. He appears well-developed and well-nourished.  HENT:  Head: Normocephalic.  Nose: Nose normal.  Mouth/Throat: Oropharynx is clear and moist.  Eyes: Conjunctivae are normal. Pupils are equal, round, and reactive to light.  Neck: Normal range of motion. Neck supple. No JVD present.  Cardiovascular: Normal rate, regular rhythm, S1 normal, S2 normal, normal heart sounds and intact distal pulses.  Exam reveals no gallop and no friction rub.   No murmur heard. Pulmonary/Chest: He is in respiratory distress. He has wheezes. He has rales. He exhibits no tenderness.  Diffuse wheezing, rhonchi  Abdominal: Soft. Bowel sounds are normal. He exhibits no distension. There is no tenderness.  Musculoskeletal: Normal range of motion. He exhibits no edema and no tenderness.  Lymphadenopathy:    He has no cervical adenopathy.  Neurological: He is alert and oriented to person, place, and time. Coordination normal.  Skin: Skin is warm and dry. No rash noted. No erythema.  Psychiatric: He has a normal mood and affect. His behavior is normal. Judgment and thought content normal.      Assessment and Plan

## 2013-03-20 NOTE — Assessment & Plan Note (Signed)
Encouraged him to stay on his pravastatin 

## 2013-03-20 NOTE — Assessment & Plan Note (Signed)
We have encouraged him to continue to work on weaning his cigarettes and smoking cessation. He will continue to work on this and does not want any assistance with chantix.  

## 2013-03-21 ENCOUNTER — Ambulatory Visit (INDEPENDENT_AMBULATORY_CARE_PROVIDER_SITE_OTHER): Payer: Commercial Managed Care - HMO | Admitting: Pulmonary Disease

## 2013-03-21 ENCOUNTER — Encounter: Payer: Self-pay | Admitting: Pulmonary Disease

## 2013-03-21 VITALS — BP 134/72 | HR 82 | Ht 71.0 in | Wt 177.0 lb

## 2013-03-21 DIAGNOSIS — R5381 Other malaise: Secondary | ICD-10-CM | POA: Insufficient documentation

## 2013-03-21 DIAGNOSIS — R0602 Shortness of breath: Secondary | ICD-10-CM

## 2013-03-21 DIAGNOSIS — J8410 Pulmonary fibrosis, unspecified: Secondary | ICD-10-CM

## 2013-03-21 DIAGNOSIS — R5383 Other fatigue: Secondary | ICD-10-CM

## 2013-03-21 DIAGNOSIS — J449 Chronic obstructive pulmonary disease, unspecified: Secondary | ICD-10-CM

## 2013-03-21 DIAGNOSIS — F17200 Nicotine dependence, unspecified, uncomplicated: Secondary | ICD-10-CM

## 2013-03-21 MED ORDER — TIOTROPIUM BROMIDE MONOHYDRATE 18 MCG IN CAPS: 18.0000 ug | ORAL_CAPSULE | Freq: Every day | RESPIRATORY_TRACT | Status: DC

## 2013-03-21 MED ORDER — SULFAMETHOXAZOLE-TMP DS 800-160 MG PO TABS: 1.0000 | ORAL_TABLET | ORAL | Status: DC

## 2013-03-21 MED ORDER — PREDNISONE 10 MG PO TABS: ORAL_TABLET | ORAL | Status: DC

## 2013-03-21 NOTE — Assessment & Plan Note (Signed)
We discussed this at length. He wants to try to quit on his own. He does not want Chantix because it didn't help in the past. We had a lengthy conversation regarding this and ultimately he decided to try to quit on his own.

## 2013-03-21 NOTE — Assessment & Plan Note (Signed)
Dennis Barajas continues to struggle with chest congestion and shortness of breath which is do to his interstitial lung disease. As noted in multiple visits prior again today I told him that the only reason why he has this condition is because of his ongoing tobacco use.  He is now frustrated with side effects from prednisone as he is currently in years 6 chronic prednisone. He's experiencing thinning of his skin which is particularly bothersome. He asked me today use a steroid sparing agent. I explained to him that I would not do this until he quit smoking because it makes no sense to expose into the increased risk from that class of medications while he continues to cause damage to his lungs with ongoing smoking.  Plan: -We need to increase his prednisone temporarily for a couple months to help with the worsening symptoms -He just really needs to quit smoking -If he still has lung disease after quitting smoking and we can consider steroid sparing agents. -Will repeat full pulmonary function testing with next visit in 2 months

## 2013-03-21 NOTE — Assessment & Plan Note (Signed)
This is likely due to his lung disease. However he needs to be screened for some basic other metabolic causes (anemia, hypothyroidism vitamin deficiency etc.). His lab work is negative then we may want to consider a sleep study.

## 2013-03-21 NOTE — Assessment & Plan Note (Signed)
Continue Advair and Spiriva Repeat pulmonary function testing this year

## 2013-03-21 NOTE — Patient Instructions (Signed)
Quit smoking Take the increased dose of prednisone (40mg  daily for a month, then 30mg  daily for a month), then back down to 20mg  Follow up with me in 2 months

## 2013-03-21 NOTE — Progress Notes (Signed)
Subjective:    Patient ID: Dennis Barajas, male    DOB: [birth year], 53 y.o.   MRN: [id number]  Synopsis: Dennis RankinsJeffrey Barajas first saw the [facility] in may of 2013 for shortness of breath. In 2009 he underwent an open lung biopsy at [facility] which showed DIP and RB ILD. He was also diagnosed with COPD and in 2011 his FEV1 was 53% predicted. He continues to use cigarettes on a regular basis. He previously was exposed to acetic acid when working at a [employer].  HPI   10/23/11 ROV -- Dennis Barajas continues to smoke one half pack to one full pack of cigarettes daily. He states that he's been having a lot more chest congestion and cough and is getting up thick yellow sputum. He denies fevers or chills. He says he does not have frank chest pain but he does have some pain in his ribs after lengthy frequent coughing spells. This is also been associated with a slight increase in shortness of breath. Overall he says he's been feeling worse with more malaise in the last 2 months. He is still taking all of his medications as written. This includes prednisone, Bactrim, Spiriva, Advair, and albuterol. He has questions today about how to use his albuterol appropriately.  12/29/2011 ROV -- Dennis Barajas returns to clinic today with about 2 weeks of cough productive of clear to green sputum, chest tightness, and shortness of breath. He states that when he was taking the higher dose of prednisone his symptoms went away and he was able to get outside and exercise more. However in the last two weeks the above mentioned symptoms have started back.  He has not been taking any thing for it.  He has not had fevers or chills.  He continues to take his prednisone, inhalers, and bactrim.  He is still smoking somewhere between 1/2 pack to 1 pack of cigarettes daily.  09/20/2012 ROV > Dennis Barajas has been feeling better since the last visit. He has gained a little weight and has remained active. He has not had  pneumonia this year which is a dramatic improvement prior to previous years. He has cut back his cigarettes to one half packs per day. He still has a cough with this clear sputum production. He has clear drainage from his runny nose nearly constantly. He is not using his Flonase anymore. He continues to use prednisone. He hasn't taken Bactrim in 2 months. He has not had any recent fevers or chills.  03/21/2013 ROV >> Dennis Barajas says that he has been having a lot of congestion for the last few months.  He thinks that perhaps this is an infection of some sort.  He has been having more sinus congestion.  He has not taken anything different for this (no antibiotics, no new prednisone).  He has been having a lot of sinus congestion. He continues to use his Flonase as well as the ipratropium nasal spray. He is also noted at quite a bit of fatigue. He doesn't have the energy to exercise during the day. He's been noting skin tears frequently do to thinning of his skin. He continues to take his prednisone his Bactrim daily.    Past Medical History  Diagnosis Date  . COPD (chronic obstructive pulmonary disease)   . OSA (obstructive sleep apnea)   . Smoker   . Acute sinusitis   . Anxiety   . Hyperlipidemia   . Memory loss   . CAD (coronary artery disease) 2009  stent placement @ [facility]      Review of Systems  Constitutional: Positive for fatigue. Negative for fever, chills and diaphoresis.  HENT: Positive for rhinorrhea and sinus pressure. Negative for congestion and postnasal drip.   Respiratory: Positive for cough and shortness of breath. Negative for chest tightness and wheezing.   Cardiovascular: Negative for chest pain and leg swelling.       Objective:   Physical Exam   Filed Vitals:   03/21/13 1640  BP: 134/72  Pulse: 82  Height: 5\' 11"  (1.803 m)  Weight: 177 lb (80.287 kg)  SpO2: 94%  RA  Gen: moon facies, no acute distress HEENT: NCAT, EOMi, OP clear,  PULM: Insp crackles in bases  R> L, wheezing bilaterally; good air movement CV: RRR, no mgr AB: BS+, soft, nontender, no hsm Ext: warm, no edema, no clubbing, no cyanosis Derm: Thin skin over hands bilaterally, old scars, no active lesions        Assessment & Plan:   DIP and RBILD Dennis Barajas continues to struggle with chest congestion and shortness of breath which is do to his interstitial lung disease. As noted in multiple visits prior again today I told him that the only reason why he has this condition is because of his ongoing tobacco use.  He is now frustrated with side effects from prednisone as he is currently in years 6 chronic prednisone. He's experiencing thinning of his skin which is particularly bothersome. He asked me today use a steroid sparing agent. I explained to him that I would not do this until he quit smoking because it makes no sense to expose into the increased risk from that class of medications while he continues to cause damage to his lungs with ongoing smoking.  Plan: -We need to increase his prednisone temporarily for a couple months to help with the worsening symptoms -He just really needs to quit smoking -If he still has lung disease after quitting smoking and we can consider steroid sparing agents. -Will repeat full pulmonary function testing with next visit in 2 months  COPD (chronic obstructive pulmonary disease) Continue Advair and Spiriva Repeat pulmonary function testing this year  TOBACCO ABUSE We discussed this at length. He wants to try to quit on his own. He does not want Chantix because it didn't help in the past. We had a lengthy conversation regarding this and ultimately he decided to try to quit on his own.  Other malaise and fatigue This is likely due to his lung disease. However he needs to be screened for some basic other metabolic causes (anemia, hypothyroidism vitamin deficiency etc.). His lab work is negative then we may want to consider a sleep study.    Updated  Medication List Outpatient Encounter Prescriptions as of 03/21/2013  Medication Sig  . albuterol (PROVENTIL) (2.5 MG/3ML) 0.083% nebulizer solution Take 2.5 mg by nebulization every 6 (six) hours as needed.    Marland Kitchen aspirin 81 MG tablet Take 81 mg by mouth daily.    Marland Kitchen buPROPion (WELLBUTRIN XL) 150 MG 24 hr tablet TAKE 2 TABLETS BY MOUTH EVERY DAY  . Diphenhydramine-APAP, sleep, (TYLENOL PM EXTRA STRENGTH PO) Take by mouth as needed.  . ferrous gluconate (FERGON) 325 MG tablet Take 325 mg by mouth daily with breakfast.  . fluticasone (FLONASE) 50 MCG/ACT nasal spray Place 2 sprays into the nose as needed.   . fluticasone-salmeterol (ADVAIR HFA) 115-21 MCG/ACT inhaler Inhale 2 puffs into the lungs as needed.  . hydrOXYzine (VISTARIL) 50 MG  capsule Take 1 capsule (50 mg total) by mouth 3 (three) times daily as needed.  Marland Kitchen ipratropium (ATROVENT) 0.03 % nasal spray Place 2 sprays into the nose 4 (four) times daily.  Marland Kitchen lisinopril (PRINIVIL,ZESTRIL) 2.5 MG tablet Take 1 tablet (2.5 mg total) by mouth daily.  . niacin 500 MG tablet Take 500 mg by mouth daily with breakfast.  . nitroGLYCERIN (NITROSTAT) 0.4 MG SL tablet Place 0.4 mg under the tongue every 5 (five) minutes as needed.    Marland Kitchen omeprazole (PRILOSEC) 20 MG capsule Take 20 mg by mouth daily.  Marland Kitchen PARoxetine (PAXIL-CR) 25 MG 24 hr tablet TAKE 1 TABLET (25 MG TOTAL) BY MOUTH EVERY MORNING.  . pravastatin (PRAVACHOL) 80 MG tablet Take 1 tablet (80 mg total) by mouth daily.  . predniSONE (DELTASONE) 20 MG tablet Take 1 tablet (20 mg total) by mouth daily.  Marland Kitchen PROAIR HFA 108 (90 BASE) MCG/ACT inhaler INHALE 2 PUFFS INTO THE LUNGS EVERY 6 HOURS AS NEEDED  . sulfamethoxazole-trimethoprim (BACTRIM DS) 800-160 MG per tablet Take 1 tablet by mouth every Monday, Wednesday, and Friday. Take one tablet 3 times weekly.  Marland Kitchen tiotropium (SPIRIVA) 18 MCG inhalation capsule Place 18 mcg into inhaler and inhale daily.    . valsartan (DIOVAN) 80 MG tablet Take 1 tablet (80 mg  total) by mouth daily.

## 2013-03-29 ENCOUNTER — Other Ambulatory Visit (INDEPENDENT_AMBULATORY_CARE_PROVIDER_SITE_OTHER): Payer: Medicare PPO

## 2013-03-29 DIAGNOSIS — R5383 Other fatigue: Secondary | ICD-10-CM

## 2013-03-29 DIAGNOSIS — R5381 Other malaise: Secondary | ICD-10-CM

## 2013-03-30 LAB — VITAMIN B12: VITAMIN B 12: 509 pg/mL (ref 211–911)

## 2013-03-30 LAB — BASIC METABOLIC PANEL
BUN: 16 mg/dL (ref 6–23)
CALCIUM: 9.7 mg/dL (ref 8.4–10.5)
CO2: 28 mEq/L (ref 19–32)
Chloride: 105 mEq/L (ref 96–112)
Creatinine, Ser: 0.9 mg/dL (ref 0.4–1.5)
GFR: 99.14 mL/min (ref >60.00)
GLUCOSE: 98 mg/dL (ref 70–99)
POTASSIUM: 4.6 meq/L (ref 3.5–5.1)
SODIUM: 144 meq/L (ref 135–145)

## 2013-03-30 LAB — CBC
HCT: 38.6 % — ABNORMAL LOW (ref 39.0–52.0)
Hemoglobin: 12.1 g/dL — ABNORMAL LOW (ref 13.0–17.0)
MCHC: 31.3 g/dL (ref 30.0–36.0)
MCV: 82.5 fl (ref 78.0–100.0)
Platelets: 335 10*3/uL (ref 150.0–400.0)
RBC: 4.68 Mil/uL (ref 4.22–5.81)
RDW: 18.4 % — ABNORMAL HIGH (ref 11.5–14.6)
WBC: 10.6 10*3/uL — AB (ref 4.5–10.5)

## 2013-03-30 LAB — VITAMIN D 25 HYDROXY (VIT D DEFICIENCY, FRACTURES): VIT D 25 HYDROXY: 19 ng/mL — AB (ref 30–89)

## 2013-03-30 LAB — TSH: TSH: 0.42 u[IU]/mL (ref 0.35–5.50)

## 2013-03-31 NOTE — Progress Notes (Signed)
lmtcb X1 

## 2013-04-04 LAB — METHYLMALONIC ACID(MMA), RND URINE
Creatinine: 14.85 mmol/L (ref 2.38–26.55)
Methylmalonic acid: 0.8 mmol/mol{creat} (ref 0.3–1.9)

## 2013-04-12 ENCOUNTER — Telehealth: Payer: Self-pay | Admitting: Pulmonary Disease

## 2013-04-12 ENCOUNTER — Other Ambulatory Visit: Payer: Self-pay | Admitting: Internal Medicine

## 2013-04-12 NOTE — Telephone Encounter (Signed)
Message copied by Velvet BatheAULFIELD, Ola Fawver L on Wed Apr 12, 2013 12:53 PM ------      Message from: Max FickleMCQUAID, DOUGLAS B      Created: Fri Mar 31, 2013 10:05 AM       A,            Please let him know that his Vit D level was low, but every thing else was normal.  He should talk to his PCP about how to treat this best.            Thanks      B ------

## 2013-04-12 NOTE — Telephone Encounter (Signed)
Spoke with pt's wife (on file) and relayed results and recs.  Nothing further needed.

## 2013-04-14 ENCOUNTER — Telehealth: Payer: Self-pay | Admitting: Pulmonary Disease

## 2013-04-14 MED ORDER — FLUTICASONE-SALMETEROL 115-21 MCG/ACT IN AERO: 2.0000 | INHALATION_SPRAY | RESPIRATORY_TRACT | Status: DC | PRN

## 2013-04-14 NOTE — Telephone Encounter (Signed)
I have sent refill to pharmacy as requested; left message for patient that we have taken care of this. If any other questions or concerns to please call the office.

## 2013-05-15 ENCOUNTER — Telehealth: Payer: Self-pay | Admitting: Internal Medicine

## 2013-05-15 NOTE — Telephone Encounter (Signed)
fyi

## 2013-05-15 NOTE — Telephone Encounter (Signed)
Patient Information:  Caller Name: Dennis Barajas  Phone: [phone]  Patient: Dennis Barajas, Dennis Barajas  Gender: Male  DOB: 22-Feb-1960  Age: 53 Years  PCP: Dennis Barajas, Dennis Barajas (Adults only)  Office Follow Up:  Does the office need to follow up with this patient?: No  Instructions For The Office: N/A  RN Note:  Office scheduler transferred for triage after scheduling apointment for 05/16/16 at 0830.  Hx COPD. Reports breathing difficulty present with tachypnea; respirations 44 rpm. BP122/81,  P- 70 on left arm at 1504. Speech mildy slurred. Lightheaded and dizzy. Lip/face color pink.  Stridor present.  Advised to call 911 now for stridor present per Breathing Difficulty.  Symptoms  Reason For Call & Symptoms: Worsening cough with chest congestion. Diarhea present with 4-5 stools daily.  Reviewed Health History In EMR: Yes  Reviewed Medications In EMR: Yes  Reviewed Allergies In EMR: Yes  Reviewed Surgeries / Procedures: Yes  Date of Onset of Symptoms: 05/10/2013  Treatments Tried: Albuterol MDI QID,  Nyquil  Treatments Tried Worked: No  Guideline(s) Used:  Breathing Difficulty  Disposition Per Guideline:   Call EMS 911 Now  Reason For Disposition Reached:   Stridor  Advice Given:  N/A  Patient Will Follow Care Advice:  YES

## 2013-05-16 ENCOUNTER — Ambulatory Visit: Payer: Medicare PPO | Admitting: Internal Medicine

## 2013-07-31 ENCOUNTER — Other Ambulatory Visit: Payer: Self-pay | Admitting: Internal Medicine

## 2013-07-31 NOTE — Telephone Encounter (Signed)
Last refill 8.26.14, last OV 4.28.15.  Please advise refill

## 2013-08-04 NOTE — Telephone Encounter (Signed)
Refill for paxil is denied. Refill one 30 days only.  [facility]

## 2013-08-10 NOTE — Telephone Encounter (Signed)
OK to fill

## 2013-09-29 ENCOUNTER — Telehealth: Payer: Self-pay

## 2013-09-29 NOTE — Telephone Encounter (Signed)
[employer] authorized 6 visits with [facility]  auth # - [id number]

## 2013-10-04 ENCOUNTER — Ambulatory Visit (INDEPENDENT_AMBULATORY_CARE_PROVIDER_SITE_OTHER): Payer: Commercial Managed Care - HMO | Admitting: Pulmonary Disease

## 2013-10-04 ENCOUNTER — Encounter: Payer: Self-pay | Admitting: Pulmonary Disease

## 2013-10-04 VITALS — BP 122/70 | HR 103 | Ht 71.0 in | Wt 166.0 lb

## 2013-10-04 DIAGNOSIS — F3289 Other specified depressive episodes: Secondary | ICD-10-CM

## 2013-10-04 DIAGNOSIS — Z23 Encounter for immunization: Secondary | ICD-10-CM

## 2013-10-04 DIAGNOSIS — F329 Major depressive disorder, single episode, unspecified: Secondary | ICD-10-CM

## 2013-10-04 DIAGNOSIS — F17200 Nicotine dependence, unspecified, uncomplicated: Secondary | ICD-10-CM

## 2013-10-04 DIAGNOSIS — J438 Other emphysema: Secondary | ICD-10-CM

## 2013-10-04 DIAGNOSIS — J8410 Pulmonary fibrosis, unspecified: Secondary | ICD-10-CM

## 2013-10-04 MED ORDER — PREDNISONE 10 MG PO TABS: 30.0000 mg | ORAL_TABLET | Freq: Every day | ORAL | Status: DC

## 2013-10-04 MED ORDER — ALBUTEROL SULFATE HFA 108 (90 BASE) MCG/ACT IN AERS: 2.0000 | INHALATION_SPRAY | Freq: Four times a day (QID) | RESPIRATORY_TRACT | Status: DC | PRN

## 2013-10-04 MED ORDER — BUPROPION HCL ER (XL) 150 MG PO TB24: ORAL_TABLET | ORAL | Status: DC

## 2013-10-04 MED ORDER — FLUTICASONE-SALMETEROL 115-21 MCG/ACT IN AERO: 2.0000 | INHALATION_SPRAY | RESPIRATORY_TRACT | Status: DC | PRN

## 2013-10-04 MED ORDER — PAROXETINE HCL ER 25 MG PO TB24: ORAL_TABLET | ORAL | Status: AC

## 2013-10-04 MED ORDER — SULFAMETHOXAZOLE-TMP DS 800-160 MG PO TABS: 1.0000 | ORAL_TABLET | ORAL | Status: DC

## 2013-10-04 MED ORDER — HYDROXYZINE PAMOATE 50 MG PO CAPS: 50.0000 mg | ORAL_CAPSULE | Freq: Three times a day (TID) | ORAL | Status: AC | PRN

## 2013-10-04 MED ORDER — TIOTROPIUM BROMIDE MONOHYDRATE 18 MCG IN CAPS: 18.0000 ug | ORAL_CAPSULE | Freq: Every day | RESPIRATORY_TRACT | Status: DC

## 2013-10-04 NOTE — Assessment & Plan Note (Addendum)
He refuses to participate in a therapeutic relationship with our clinic as he has not shown up when we asked (was supposed to return in May) and he has made no real effort to quit smoking that I can tell.  DIP and RBILD will go away with smoking cessation, I have told him this on every visit and educated him today about the severe side effects of prednisone.  Advised at length to quit smoking, but I don't know if he will ever quit.  Continue prednisone and bactrim

## 2013-10-04 NOTE — Patient Instructions (Signed)
See Dr. Darrick Huntsman Take your medicines as prescribed Quit smoking F/U 6 months

## 2013-10-04 NOTE — Assessment & Plan Note (Signed)
Paxil and wellbutrin refilled, one month for all.  I told him he needs to go back to his PCP.

## 2013-10-04 NOTE — Assessment & Plan Note (Signed)
Did not really try to quit after our last lengthy conversation about this.  This is very frustrating.  He has failed nicotine patches, Chantix, wellbutrin and the e-cigarette.  I encouraged him to continue to to try to quit.

## 2013-10-04 NOTE — Progress Notes (Signed)
Subjective:    Patient ID: Dennis Barajas, male    DOB: April 03, 1960, 53 y.o.   MRN: [id number]  Synopsis: Aryn Kops first saw the [facility] pulmonary clinic in may of 2013 for shortness of breath. In 2009 he underwent an open lung biopsy at [facility] which showed DIP and RB ILD. He was also diagnosed with COPD and in 2011 his FEV1 was 53% predicted. He continues to use cigarettes on a regular basis. He previously was exposed to acetic acid when working at a [employer].  HPI   10/04/13 ROV > [name] says he is doing OK, hanging in there right now.  He has some sinus congestion which drains down into his chest.  He thinks that this is coming from the CPAP.  He has some shortness of breaht but not any worse than before.  No episodes of pneumonia since the last visit.  His main problem has been a a lot of gas and bloating after being on CPAP.   He continues to smoke cigarettes.  He has not been back to his PCP.      Past Medical History  Diagnosis Date  . COPD (chronic obstructive pulmonary disease)   . OSA (obstructive sleep apnea)   . Smoker   . Acute sinusitis   . Anxiety   . Hyperlipidemia   . Memory loss   . CAD (coronary artery disease) 2009    stent placement @ [facility]      Review of Systems  Constitutional: Positive for fatigue. Negative for fever, chills and diaphoresis.  HENT: Positive for rhinorrhea and sinus pressure. Negative for congestion and postnasal drip.   Respiratory: Positive for cough and shortness of breath. Negative for chest tightness and wheezing.   Cardiovascular: Negative for chest pain and leg swelling.       Objective:   Physical Exam   Filed Vitals:   10/04/13 1652  BP: 122/70  Pulse: 103  Height:  (1.803 m)  Weight: 166 lb (75.297 kg)  SpO2: 94%  RA  Gen: moon facies, no acute distress HEENT: NCAT, EOMi, OP clear,  PULM: Inspiratory crackles in bases, no wheezing CV: RRR, no mgr AB: BS+, soft, nontender Ext: warm,  no edema, no clubbing, no cyanosis Derm: Thin skin over hands bilaterally, old scars, no active lesions        Assessment & Plan:   TOBACCO ABUSE Did not really try to quit after our last lengthy conversation about this.  This is very frustrating.  He has failed nicotine patches, Chantix, wellbutrin and the e-cigarette.  I encouraged him to continue to to try to quit.  DIP and RBILD He refuses to participate in a therapeutic relationship with our clinic as he has not shown up when we asked (was supposed to return in May) and he has made no real effort to quit smoking that I can tell.  DIP and RBILD will go away with smoking cessation, I have told him this on every visit and educated him today about the severe side effects of prednisone.  Advised at length to quit smoking, but I don't know if he will ever quit.  Continue prednisone and bactrim  COPD (chronic obstructive pulmonary disease) As above Flu shot today Inhalers refilled  DEPRESSIVE DISORDER NOT ELSEWHERE CLASSIFIED Paxil and wellbutrin refilled, one month for all.  I told him he needs to go back to his PCP.    Updated Medication List Outpatient Encounter Prescriptions as of 10/04/2013  Medication Sig  . aspirin 81 MG tablet Take 81 mg by mouth daily.    . Diphenhydramine-APAP, sleep, (TYLENOL PM EXTRA STRENGTH PO) Take by mouth as needed.  . ferrous gluconate (FERGON) 325 MG tablet Take 325 mg by mouth daily with breakfast.  . fluticasone (FLONASE) 50 MCG/ACT nasal spray Place 2 sprays into the nose as needed.   Marland Kitchen lisinopril (PRINIVIL,ZESTRIL) 2.5 MG tablet Take 1 tablet (2.5 mg total) by mouth daily.  . niacin 500 MG tablet Take 500 mg by mouth daily with breakfast.  . nitroGLYCERIN (NITROSTAT) 0.4 MG SL tablet Place 0.4 mg under the tongue every 5 (five) minutes as needed.    Marland Kitchen omeprazole (PRILOSEC) 20 MG capsule Take 20 mg by mouth daily.  . pravastatin (PRAVACHOL) 80 MG tablet Take 1 tablet (80 mg total) by mouth  daily.  . valsartan (DIOVAN) 80 MG tablet Take 1 tablet (80 mg total) by mouth daily.  . [DISCONTINUED] predniSONE (DELTASONE) 10 MG tablet  daily for a month, then  daily for a month, then back down to   . albuterol (PROAIR HFA) 108 (90 BASE) MCG/ACT inhaler Inhale 2 puffs into the lungs every 6 (six) hours as needed.  Marland Kitchen albuterol (PROVENTIL) (2.5 MG/3ML) 0.083% nebulizer solution Take 2.5 mg by nebulization every 6 (six) hours as needed.    Marland Kitchen buPROPion (WELLBUTRIN XL) 150 MG 24 hr tablet One tablet daily  . fluticasone-salmeterol (ADVAIR HFA) 115-21 MCG/ACT inhaler Inhale 2 puffs into the lungs as needed.  . hydrOXYzine (VISTARIL) 50 MG capsule Take 1 capsule (50 mg total) by mouth 3 (three) times daily as needed.  Marland Kitchen PARoxetine (PAXIL-CR) 25 MG 24 hr tablet TAKE 1 TABLET (25 MG TOTAL) BY MOUTH EVERY MORNING.  . predniSONE (DELTASONE) 10 MG tablet Take 3 tablets (30 mg total) by mouth daily. 30 mg daily  . PROAIR HFA 108 (90 BASE) MCG/ACT inhaler INHALE 2 PUFFS INTO THE LUNGS EVERY 6 HOURS AS NEEDED  . sulfamethoxazole-trimethoprim (BACTRIM DS) 800-160 MG per tablet Take 1 tablet by mouth every Monday, Wednesday, and Friday. Take one tablet 3 times weekly.  Marland Kitchen tiotropium (SPIRIVA) 18 MCG inhalation capsule Place 1 capsule (18 mcg total) into inhaler and inhale daily.  . [DISCONTINUED] buPROPion (WELLBUTRIN XL) 150 MG 24 hr tablet TAKE 2 TABLETS BY MOUTH EVERY DAY  . [DISCONTINUED] fluticasone-salmeterol (ADVAIR HFA) 115-21 MCG/ACT inhaler Inhale 2 puffs into the lungs as needed.  . [DISCONTINUED] hydrOXYzine (VISTARIL) 50 MG capsule Take 1 capsule (50 mg total) by mouth 3 (three) times daily as needed.  . [DISCONTINUED] ipratropium (ATROVENT) 0.03 % nasal spray Place 2 sprays into the nose 4 (four) times daily.  . [DISCONTINUED] PARoxetine (PAXIL-CR) 25 MG 24 hr tablet TAKE 1 TABLET (25 MG TOTAL) BY MOUTH EVERY MORNING.  . [DISCONTINUED] predniSONE (DELTASONE) 10 MG tablet 30 mg daily   . [DISCONTINUED] predniSONE (DELTASONE) 20 MG tablet Take 1 tablet (20 mg total) by mouth daily.  . [DISCONTINUED] sulfamethoxazole-trimethoprim (BACTRIM DS) 800-160 MG per tablet Take 1 tablet by mouth every Monday, Wednesday, and Friday. Take one tablet 3 times weekly.  . [DISCONTINUED] tiotropium (SPIRIVA) 18 MCG inhalation capsule Place 1 capsule (18 mcg total) into inhaler and inhale daily.

## 2013-10-04 NOTE — Assessment & Plan Note (Signed)
As above Flu shot today Inhalers refilled

## 2013-10-11 ENCOUNTER — Telehealth: Payer: Self-pay | Admitting: Pulmonary Disease

## 2013-10-11 MED ORDER — ALBUTEROL SULFATE (2.5 MG/3ML) 0.083% IN NEBU: 2.5000 mg | INHALATION_SOLUTION | Freq: Four times a day (QID) | RESPIRATORY_TRACT | Status: AC | PRN

## 2013-10-11 NOTE — Telephone Encounter (Signed)
Called spoke with pt spouse. Aware RX has been sent in. nothing further needed

## 2013-10-25 ENCOUNTER — Other Ambulatory Visit: Payer: Self-pay | Admitting: *Deleted

## 2013-10-25 MED ORDER — LISINOPRIL 2.5 MG PO TABS: 2.5000 mg | ORAL_TABLET | Freq: Every day | ORAL | Status: DC

## 2013-10-27 ENCOUNTER — Other Ambulatory Visit: Payer: Self-pay | Admitting: *Deleted

## 2013-10-27 MED ORDER — LISINOPRIL 2.5 MG PO TABS: 2.5000 mg | ORAL_TABLET | Freq: Every day | ORAL | Status: AC

## 2013-11-15 ENCOUNTER — Ambulatory Visit (INDEPENDENT_AMBULATORY_CARE_PROVIDER_SITE_OTHER): Payer: Commercial Managed Care - HMO | Admitting: Internal Medicine

## 2013-11-15 ENCOUNTER — Encounter: Payer: Self-pay | Admitting: Internal Medicine

## 2013-11-15 VITALS — BP 128/80 | HR 99 | Temp 98.6°F | Resp 18 | Ht 71.0 in | Wt 167.5 lb

## 2013-11-15 DIAGNOSIS — Z23 Encounter for immunization: Secondary | ICD-10-CM

## 2013-11-15 DIAGNOSIS — Z79899 Other long term (current) drug therapy: Secondary | ICD-10-CM

## 2013-11-15 DIAGNOSIS — G4733 Obstructive sleep apnea (adult) (pediatric): Secondary | ICD-10-CM

## 2013-11-15 DIAGNOSIS — K5909 Other constipation: Secondary | ICD-10-CM

## 2013-11-15 DIAGNOSIS — F17200 Nicotine dependence, unspecified, uncomplicated: Secondary | ICD-10-CM

## 2013-11-15 DIAGNOSIS — I2510 Atherosclerotic heart disease of native coronary artery without angina pectoris: Secondary | ICD-10-CM

## 2013-11-15 DIAGNOSIS — F418 Other specified anxiety disorders: Secondary | ICD-10-CM

## 2013-11-15 DIAGNOSIS — Z720 Tobacco use: Secondary | ICD-10-CM

## 2013-11-15 DIAGNOSIS — Z125 Encounter for screening for malignant neoplasm of prostate: Secondary | ICD-10-CM

## 2013-11-15 DIAGNOSIS — E785 Hyperlipidemia, unspecified: Secondary | ICD-10-CM

## 2013-11-15 DIAGNOSIS — R748 Abnormal levels of other serum enzymes: Secondary | ICD-10-CM

## 2013-11-15 MED ORDER — ERGOCALCIFEROL 1.25 MG (50000 UT) PO CAPS: 50000.0000 [IU] | ORAL_CAPSULE | ORAL | Status: DC

## 2013-11-15 NOTE — Patient Instructions (Signed)
You can use Miralax,  Citrucel,  Benefiber,  Or metamucil DAILY  To keep your bowels moving  You can add a stool softener,  Colace, (docyusate) 100 mg up to twcie daily if stools are still hard to pass  Constipation Constipation is when a person:  Poops (has a bowel movement) less than 3 times a week.  Has a hard time pooping.  Has poop that is dry, hard, or bigger than normal. HOME CARE   Eat foods with a lot of fiber in them. This includes fruits, vegetables, beans, and whole grains such as brown rice.  Avoid fatty foods and foods with a lot of sugar. This includes french fries, hamburgers, cookies, candy, and soda.  If you are not getting enough fiber from food, take products with added fiber in them (supplements).  Drink enough fluid to keep your pee (urine) clear or pale yellow.  Exercise on a regular basis, or as told by your doctor.  Go to the restroom when you feel like you need to poop. Do not hold it.  Only take medicine as told by your doctor. Do not take medicines that help you poop (laxatives) without talking to your doctor first. GET HELP RIGHT AWAY IF:   You have bright red blood in your poop (stool).  Your constipation lasts more than 4 days or gets worse.  You have belly (abdominal) or butt (rectal) pain.  You have thin poop (as thin as a pencil).  You lose weight, and it cannot be explained. MAKE SURE YOU:   Understand these instructions.  Will watch your condition.  Will get help right away if you are not doing well or get worse. Document Released: 06/24/2007 Document Revised: 01/10/2013 Document Reviewed: 10/17/2012 [facility] Patient Information 2015 [employer] [place]. This information is not intended to replace advice given to you by your health care provider. Make sure you discuss any questions you have with your health care provider.
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
## 2013-11-15 NOTE — Progress Notes (Signed)
Pre-visit discussion using our clinic review tool. No additional management support is needed unless otherwise documented below in the visit note.  

## 2013-11-16 LAB — LIPID PANEL
CHOL/HDL RATIO: 6
Cholesterol: 194 mg/dL (ref 0–200)
HDL: 30.9 mg/dL — AB (ref >39.00)
NONHDL: 163.1
TRIGLYCERIDES: 408 mg/dL — AB (ref 0.0–149.0)
VLDL: 81.6 mg/dL — ABNORMAL HIGH (ref 0.0–40.0)

## 2013-11-16 LAB — COMPREHENSIVE METABOLIC PANEL
ALK PHOS: 154 U/L — AB (ref 39–117)
ALT: 25 U/L (ref 0–53)
AST: 21 U/L (ref 0–37)
Albumin: 3.4 g/dL — ABNORMAL LOW (ref 3.5–5.2)
BUN: 14 mg/dL (ref 6–23)
CO2: 24 mEq/L (ref 19–32)
Calcium: 9.2 mg/dL (ref 8.4–10.5)
Chloride: 102 mEq/L (ref 96–112)
Creatinine, Ser: 1 mg/dL (ref 0.4–1.5)
GFR: 80.32 mL/min (ref >60.00)
Glucose, Bld: 71 mg/dL (ref 70–99)
Potassium: 4.4 mEq/L (ref 3.5–5.1)
Sodium: 137 mEq/L (ref 135–145)
Total Bilirubin: 0.3 mg/dL (ref 0.2–1.2)
Total Protein: 6.8 g/dL (ref 6.0–8.3)

## 2013-11-16 LAB — LDL CHOLESTEROL, DIRECT: LDL DIRECT: 93.2 mg/dL

## 2013-11-16 LAB — PSA: PSA: 0.28 ng/mL (ref 0.10–4.00)

## 2013-11-18 ENCOUNTER — Encounter: Payer: Self-pay | Admitting: Internal Medicine

## 2013-11-18 ENCOUNTER — Telehealth: Payer: Self-pay | Admitting: Internal Medicine

## 2013-11-18 DIAGNOSIS — K5900 Constipation, unspecified: Secondary | ICD-10-CM | POA: Insufficient documentation

## 2013-11-18 DIAGNOSIS — R748 Abnormal levels of other serum enzymes: Secondary | ICD-10-CM | POA: Insufficient documentation

## 2013-11-18 DIAGNOSIS — Z125 Encounter for screening for malignant neoplasm of prostate: Secondary | ICD-10-CM | POA: Insufficient documentation

## 2013-11-18 NOTE — Assessment & Plan Note (Signed)
Spent > 10 min  discussing tobacco cessation,  prior trials of failed wellbutrin, patches, chantix.  Patches were the most effective. Smoking cessation instruction/counseling given:  counseled patient on the dangers of tobacco use, advised patient to stop smoking, and reviewed strategies to maximize success

## 2013-11-18 NOTE — Assessment & Plan Note (Addendum)
Recommended an increase in fiber intake to 25 to 35 grams daily.  Made several dietary suggestions of high fiber content and recommended daily use of Miralax., metamucil, citrucel, benefiber  or fibercon. Cautioned agains regular use of goody's poders due to asa content and caffeine content

## 2013-11-18 NOTE — Assessment & Plan Note (Signed)
Chronic ,  etiology may be liver vs bone.  Will check isoenyzmes,  Liver ultrasound, Vit D level and bone density test

## 2013-11-18 NOTE — Assessment & Plan Note (Addendum)
Review of [facility] fro [facility] indicate he has been taking paxil and wellbutrin since at least 2009. He did not  discuss his symptoms of anxieyt and depression today  Or request a change in medication ,  Since the hydroxyzine is helping manage his anxiety.  The majority of the time was spent discussion his difficulty with tobacco cessation and constipation.

## 2013-11-18 NOTE — Assessment & Plan Note (Signed)
Screening for prostate CA was done today with PSA only    Lab Results  Component Value Date   PSA 0.28 11/15/2013

## 2013-11-18 NOTE — Assessment & Plan Note (Addendum)
Diagnosed by sleep study. he is wearing her CPAP every night a minimum of 6 hours per night and notes improved daytime wakefulness and decreased fatigue  

## 2013-11-18 NOTE — Telephone Encounter (Signed)
One of his liver emzymes came back elevated.  It was elevated 2 years ago but he never returned for discussion.   i would like to obtain an abdominal ultrasound and additional labs for evaluation

## 2013-11-18 NOTE — Assessment & Plan Note (Addendum)
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

## 2013-11-18 NOTE — Assessment & Plan Note (Addendum)
He has been asymptomatic but continues to smoke. Continue asa,  lisinopril, pravastatin, losartan and pravastatin.  Local follow up is with Dossie Arbourim Gollan

## 2013-11-20 ENCOUNTER — Encounter: Payer: Self-pay | Admitting: *Deleted

## 2013-11-20 NOTE — Telephone Encounter (Signed)
Left  Message for patient to return call to office. 

## 2013-11-21 ENCOUNTER — Ambulatory Visit: Payer: Self-pay | Admitting: Internal Medicine

## 2013-11-21 ENCOUNTER — Other Ambulatory Visit (INDEPENDENT_AMBULATORY_CARE_PROVIDER_SITE_OTHER): Payer: Medicare HMO

## 2013-11-21 ENCOUNTER — Telehealth: Payer: Self-pay | Admitting: Internal Medicine

## 2013-11-21 ENCOUNTER — Other Ambulatory Visit: Payer: Self-pay | Admitting: Internal Medicine

## 2013-11-21 DIAGNOSIS — E559 Vitamin D deficiency, unspecified: Secondary | ICD-10-CM

## 2013-11-21 DIAGNOSIS — R748 Abnormal levels of other serum enzymes: Secondary | ICD-10-CM

## 2013-11-21 NOTE — Telephone Encounter (Signed)
In red folder. 

## 2013-11-21 NOTE — Telephone Encounter (Signed)
Patient scheduled for lab and has had US today,

## 2013-11-21 NOTE — Telephone Encounter (Signed)
I will NOT be filling out FMLA forms. I have seen this patient once in 2 years.  FMLA was not discussed at his visit.

## 2013-11-21 NOTE — Telephone Encounter (Signed)
Pt wife-Sharon dropped off FMLA forms to be filled out. Forms in Dr. Melina Schoolsullo's box. Please call [name] when ready to be picked up/msn

## 2013-11-21 NOTE — Telephone Encounter (Signed)
Left message for patient to return call to office. 

## 2013-11-22 ENCOUNTER — Telehealth: Payer: Self-pay | Admitting: Internal Medicine

## 2013-11-22 DIAGNOSIS — Z7689 Persons encountering health services in other specified circumstances: Secondary | ICD-10-CM

## 2013-11-22 NOTE — Telephone Encounter (Signed)
Physician part of the FMLA form is done. [name] needs to fill out he part on the first page.  In red folder

## 2013-11-23 ENCOUNTER — Telehealth: Payer: Self-pay | Admitting: Internal Medicine

## 2013-11-23 ENCOUNTER — Encounter: Payer: Self-pay | Admitting: Internal Medicine

## 2013-11-23 DIAGNOSIS — R748 Abnormal levels of other serum enzymes: Secondary | ICD-10-CM

## 2013-11-23 DIAGNOSIS — I700 Atherosclerosis of aorta: Secondary | ICD-10-CM | POA: Insufficient documentation

## 2013-11-23 DIAGNOSIS — K760 Fatty (change of) liver, not elsewhere classified: Secondary | ICD-10-CM | POA: Insufficient documentation

## 2013-11-23 LAB — VITAMIN D 25 HYDROXY (VIT D DEFICIENCY, FRACTURES): VITD: 22.16 ng/mL — ABNORMAL LOW (ref 30.00–100.00)

## 2013-11-23 NOTE — Telephone Encounter (Signed)
MD decided to fill out FMLA for patient wife for patient MD appointments  Placed copy for scan to patient chart one for billing and placed original for patient to pick up patient notified.

## 2013-11-23 NOTE — Telephone Encounter (Signed)
Patient would [facility] to [facility] to confirm ,Please advise.

## 2013-11-23 NOTE — Telephone Encounter (Signed)
Referral is in process as requested to Dr Ardine Engiehl

## 2013-11-23 NOTE — Telephone Encounter (Signed)
Your Ultrasound suggested fatty liver.  This is a diagnosis that would require liver biopsy to confirm, and additional labs tests to rule out other less common causes.  If you would like to proceed,  I will refer you to Dr [facility] at [facility] who specializes in fatty liver.  If you would like to defer that procedure for now,  I can do the lab tests at your next visit .  I recommend continuing  the pravastatin for your cholesterol , and following a low glycemic index diet to   lower your  triglycerides which are currently over 400.  You should have a repeat lipid panel in 3 months and should begin the vaccination series for hepatitis A and B

## 2013-11-24 ENCOUNTER — Encounter: Payer: Self-pay | Admitting: *Deleted

## 2013-11-24 DIAGNOSIS — E559 Vitamin D deficiency, unspecified: Secondary | ICD-10-CM | POA: Insufficient documentation

## 2013-11-24 NOTE — Telephone Encounter (Signed)
Patient aware.

## 2013-11-27 ENCOUNTER — Telehealth: Payer: Self-pay

## 2013-11-27 LAB — ALKALINE PHOSPHATASE ISOENZYMES
Alkaline Phonsphatase: 146 U/L — ABNORMAL HIGH (ref 40–115)
Bone Isoenzymes: 57 % (ref 28–66)
Intestinal Isoenzymes: 2 % (ref 1–24)
LIVER ISOENZYMES (ALP ISO): 41 % (ref 25–69)
Macrohepatic isoenzymes: 0 %

## 2013-11-27 NOTE — Telephone Encounter (Signed)
Left message for patient Dennis Barajas( Sharon ) wife to return call to office.

## 2013-11-27 NOTE — Telephone Encounter (Signed)
The pt's wife called back and is asking for a callback on her cell - [phone]

## 2013-11-27 NOTE — Telephone Encounter (Signed)
[name], the pts wife called and is hoping to get clarification on the patient's lab work results. She stated the patient was not clear after the first conversation regarding his labs   Callback - [phone]

## 2013-11-28 NOTE — Telephone Encounter (Signed)
Left message for patient to return call, 

## 2013-11-29 ENCOUNTER — Telehealth: Payer: Self-pay | Admitting: Internal Medicine

## 2013-11-29 NOTE — Telephone Encounter (Signed)
Mrs. Dennis Barajas stopped by concerned about news she thinks her husband received from someone here. She said he got a phone call saying he has an enlarged liver and she has questions about that. She'd like a phone call from Dr. Darrick Huntsmanullo or Olegario MessierKathy if possible. Dennis Barajas (wife): [phone] Thank you.

## 2013-11-29 NOTE — Telephone Encounter (Signed)
Patient wife notified of results and voiced understanding.

## 2013-12-19 ENCOUNTER — Encounter: Payer: Self-pay | Admitting: Internal Medicine

## 2014-01-31 ENCOUNTER — Ambulatory Visit: Payer: Commercial Managed Care - HMO | Admitting: Nurse Practitioner

## 2014-01-31 ENCOUNTER — Encounter: Payer: Self-pay | Admitting: Nurse Practitioner

## 2014-01-31 ENCOUNTER — Ambulatory Visit: Payer: Self-pay | Admitting: Nurse Practitioner

## 2014-01-31 ENCOUNTER — Ambulatory Visit (INDEPENDENT_AMBULATORY_CARE_PROVIDER_SITE_OTHER): Payer: Commercial Managed Care - HMO | Admitting: Nurse Practitioner

## 2014-01-31 VITALS — BP 128/70 | HR 86 | Temp 98.0°F | Resp 16 | Ht 71.0 in | Wt 167.8 lb

## 2014-01-31 DIAGNOSIS — M436 Torticollis: Secondary | ICD-10-CM

## 2014-01-31 DIAGNOSIS — M542 Cervicalgia: Secondary | ICD-10-CM

## 2014-01-31 DIAGNOSIS — M47892 Other spondylosis, cervical region: Secondary | ICD-10-CM | POA: Diagnosis not present

## 2014-01-31 DIAGNOSIS — M419 Scoliosis, unspecified: Secondary | ICD-10-CM | POA: Diagnosis not present

## 2014-01-31 DIAGNOSIS — M47812 Spondylosis without myelopathy or radiculopathy, cervical region: Secondary | ICD-10-CM | POA: Diagnosis not present

## 2014-01-31 DIAGNOSIS — H6980 Other specified disorders of Eustachian tube, unspecified ear: Secondary | ICD-10-CM

## 2014-01-31 MED ORDER — FLUTICASONE PROPIONATE 50 MCG/ACT NA SUSP: 1.0000 | Freq: Every day | NASAL | Status: DC

## 2014-01-31 MED ORDER — TIZANIDINE HCL 2 MG PO TABS: 2.0000 mg | ORAL_TABLET | Freq: Every day | ORAL | Status: DC
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
## 2014-01-31 NOTE — Progress Notes (Signed)
Pre visit review using our clinic review tool, if applicable. No additional management support is needed unless otherwise documented below in the visit note. 

## 2014-01-31 NOTE — Patient Instructions (Signed)
[facility] go to [facility] for [facility]ray of neck with the order.   Flonase for helping ears and nasal passages.   I have asked our referral coordiniator for more information.

## 2014-02-03 DIAGNOSIS — M542 Cervicalgia: Secondary | ICD-10-CM | POA: Insufficient documentation

## 2014-02-03 DIAGNOSIS — H6980 Other specified disorders of Eustachian tube, unspecified ear: Secondary | ICD-10-CM | POA: Insufficient documentation

## 2014-02-03 NOTE — Assessment & Plan Note (Signed)
Stable. Flonase spray 2 sprays in each nare daily.

## 2014-02-03 NOTE — Assessment & Plan Note (Signed)
Stable. Obtain updated Ne[facility] at [facility]. FU prn.

## 2014-02-12 ENCOUNTER — Other Ambulatory Visit: Payer: Self-pay | Admitting: Internal Medicine

## 2014-02-19 ENCOUNTER — Telehealth: Payer: Self-pay | Admitting: Internal Medicine

## 2014-02-19 ENCOUNTER — Telehealth: Payer: Self-pay | Admitting: Nurse Practitioner

## 2014-02-19 ENCOUNTER — Other Ambulatory Visit: Payer: Self-pay | Admitting: Cardiovascular Disease

## 2014-02-19 NOTE — Telephone Encounter (Signed)
Please dis regard stated on paper work not for provider to fill out.

## 2014-02-19 NOTE — Telephone Encounter (Signed)
Wanting cervical spine xray results.

## 2014-02-19 NOTE — Telephone Encounter (Signed)
Find out about the results for [employer]. Thanks!

## 2014-02-19 NOTE — Telephone Encounter (Signed)
Pt dropped off social security forms to be filled out. Paper work in Dr. Melina Schoolsullo's box/msn

## 2014-02-19 NOTE — Telephone Encounter (Signed)
Placed in red folder  

## 2014-02-20 ENCOUNTER — Other Ambulatory Visit: Payer: Self-pay | Admitting: Internal Medicine

## 2014-02-20 NOTE — Telephone Encounter (Signed)
X ray results requested

## 2014-02-20 NOTE — Telephone Encounter (Signed)
Called patient and spoke with wife in resulting xray. Wife verbalized understanding and was encouraged to call us back if pt has worsening condition.

## 2014-02-20 NOTE — Telephone Encounter (Signed)
He will need to stop the Tizanidine for those symptoms. All muscle relaxants will produce the same symptoms. We can do physical therapy, if he wants?

## 2014-02-20 NOTE — Telephone Encounter (Signed)
Patient DPR notified of results voiced understanding. Patient DPR patient has been unable to the Tizanidine causes constipation and dizziness please advisse

## 2014-02-20 NOTE — Telephone Encounter (Signed)
Needs to schedule a follow up appointment

## 2014-02-20 NOTE — Telephone Encounter (Signed)
Notified patient Dennis Barajas and she stated she would call back if they decided to try physical therapy. FYI

## 2014-02-20 NOTE — Telephone Encounter (Signed)
-----   Message from Carollee Leitzarrie M Doss, NP sent at 02/20/2014 11:16 AM EST ----- Would either of you (Denisa or Olegario MessierKathy) please call Mr. Dennis Barajas and let him know the X-ray of his neck shows minimal degenerative changes and mild scoliosis. This may or may not be the cause of pain. These are findings in people without symptoms also, so we can't be sure. Thanks!

## 2014-02-24 ENCOUNTER — Other Ambulatory Visit: Payer: Self-pay | Admitting: Nurse Practitioner

## 2014-02-28 ENCOUNTER — Encounter: Payer: Self-pay | Admitting: Nurse Practitioner

## 2014-03-20 ENCOUNTER — Telehealth: Payer: Self-pay | Admitting: *Deleted

## 2014-03-20 NOTE — Telephone Encounter (Signed)
pts wife called in reference to pts C Pap machine.  Returned called, no answer.  Left message to return call.

## 2014-05-02 ENCOUNTER — Encounter: Payer: Self-pay | Admitting: Internal Medicine

## 2014-05-18 ENCOUNTER — Ambulatory Visit: Payer: Commercial Managed Care - HMO | Admitting: Internal Medicine

## 2014-05-25 ENCOUNTER — Ambulatory Visit (INDEPENDENT_AMBULATORY_CARE_PROVIDER_SITE_OTHER): Payer: Self-pay | Admitting: Internal Medicine

## 2014-05-25 DIAGNOSIS — E785 Hyperlipidemia, unspecified: Secondary | ICD-10-CM

## 2014-05-27 NOTE — Progress Notes (Signed)
Patient ID: Dennis CarryJeffrey L Hobart, male   DOB: [birth year], 54 y.o.   MRN: [id number] Patient no showed for 30 mintues visit /follow up on hyperlipidemia, CAD, etrc.

## 2014-05-27 NOTE — Assessment & Plan Note (Signed)
Patient no shfor 6 month follow up.  meds will not be renewed.

## 2014-06-11 ENCOUNTER — Other Ambulatory Visit: Payer: Self-pay | Admitting: Nurse Practitioner

## 2014-06-22 ENCOUNTER — Ambulatory Visit: Payer: Commercial Managed Care - HMO | Admitting: Pulmonary Disease

## 2014-06-22 ENCOUNTER — Telehealth: Payer: Self-pay | Admitting: Pulmonary Disease

## 2014-06-22 NOTE — Telephone Encounter (Signed)
Patient was not aware that todays appt cancelled as he had wrong contact info and did not receive vm. Patient has been out of Prednisone for 2 months and needs refill   Patient also wants to be seen in [place] only and is est. Patient with [facility].  Does he need to est with new [place] doc or will [facility] continue to see Patients in [place]? Please call patient wife Dennis Barajas   1. Which medications need to be refilled? Prednisone 10 mg 3 x daily   2. Which pharmacy is medication to be sent to? [facility]   3. Do they need a 30 day or 90 day supply? 30  4. Would they like a call back once the medication has been sent to the pharmacy? YES

## 2014-06-22 NOTE — Telephone Encounter (Signed)
Left message for patient to call back  

## 2014-07-17 ENCOUNTER — Telehealth: Payer: Self-pay | Admitting: Gastroenterology

## 2014-07-17 NOTE — Telephone Encounter (Signed)
LVM for pt to call and sched. appt for elevated liver functions

## 2014-07-18 ENCOUNTER — Ambulatory Visit (INDEPENDENT_AMBULATORY_CARE_PROVIDER_SITE_OTHER): Payer: Commercial Managed Care - HMO | Admitting: Gastroenterology

## 2014-07-18 ENCOUNTER — Encounter: Payer: Self-pay | Admitting: Gastroenterology

## 2014-07-18 VITALS — BP 129/81 | HR 88 | Temp 98.5°F | Ht 71.0 in | Wt 156.0 lb

## 2014-07-18 DIAGNOSIS — R748 Abnormal levels of other serum enzymes: Secondary | ICD-10-CM

## 2014-07-18 NOTE — Progress Notes (Signed)
Gastroenterology Consultation  Referring Provider:     Sherlene Shams, MD Primary Care Physician:  Sherlene Shams, MD Primary Gastroenterologist:  Dr. Servando Snare     Reason for Consultation:     Increased alkaline phosphatase        HPI:   Dennis Barajas is a 54 y.o. y/o male referred for consultation & management of abnormal liver enzymes by Dr. Sherlene Shams, MD.  This patient comes today after having abnormal liver enzymes with isolated increase alkaline phosphatase back in October and November of last year. The patient had the alkaline phosphatase fractionated which showed the patient to have 57% from bone and 41% on the liver. Patient reports that he has had bone pain with arthritis. The patient's other liver enzymes were normal. The patient also had an ultrasound of the liver that showed mild fatty changes but no signs of any chronic liver disease. The patient was set up at the end of last year to see Dr. Barbie Banner and the patient was supposed to see that doctor yesterday. The wife states that when they called to confirm the appointment last week they were told that the appointment had been changed and they would have to wait another month. That's when the patient may not appointment with me for this problem. The patient denies any alcohol abuse. He also denies being told that he had abnormal liver enzymes prior to his recent episode.  Past Medical History  Diagnosis Date  . COPD (chronic obstructive pulmonary disease)   . OSA (obstructive sleep apnea)   . Smoker   . Acute sinusitis   . Anxiety   . Hyperlipidemia   . Memory loss   . CAD (coronary artery disease) 2009    stent placement @ [facility]    Past Surgical History  Procedure Laterality Date  . Cholecystectomy    . Appendectomy    . Knee surgery      bilateral knee surgery  . Cardiac catheterization  2009    stent placed @ [facility]   . Lung biopsy      x 2     Prior to Admission medications   Medication Sig Start Date End  Date Taking? Authorizing Provider  albuterol (PROAIR HFA) 108 (90 BASE) MCG/ACT inhaler Inhale 2 puffs into the lungs every 6 (six) hours as needed. 10/04/13  Yes Lupita Leash, MD  albuterol (PROVENTIL) (2.5 MG/3ML) 0.083% nebulizer solution Take 3 mLs (2.5 mg total) by nebulization every 6 (six) hours as needed. Dx 496 10/11/13  Yes Lupita Leash, MD  aspirin 81 MG tablet Take 81 mg by mouth daily.     Yes Historical Provider, MD  buPROPion (WELLBUTRIN XL) 150 MG 24 hr tablet One tablet daily 10/04/13  Yes Lupita Leash, MD  Diphenhydramine-APAP, sleep, (TYLENOL PM EXTRA STRENGTH PO) Take by mouth as needed.   Yes Historical Provider, MD  ergocalciferol (VITAMIN D2) 50000 UNITS capsule TAKE 1 CAPSULE (50,000 UNITS TOTAL) BY MOUTH ONCE A WEEK. 02/20/14  Yes Sherlene Shams, MD  fluticasone (FLONASE) 50 MCG/ACT nasal spray PLACE 1 SPRAY INTO BOTH NOSTRILS DAILY. 06/11/14  Yes Carollee Leitz, NP  fluticasone-salmeterol (ADVAIR HFA) 409-81 MCG/ACT inhaler Inhale 2 puffs into the lungs as needed. 10/04/13  Yes Lupita Leash, MD  hydrOXYzine (VISTARIL) 50 MG capsule Take 1 capsule (50 mg total) by mouth 3 (three) times daily as needed. 10/04/13  Yes Lupita Leash, MD  lisinopril (PRINIVIL,ZESTRIL) 2.5 MG tablet  Take 1 tablet (2.5 mg total) by mouth daily. 10/27/13  Yes Antonieta Iba, MD  niacin 500 MG tablet Take 500 mg by mouth daily with breakfast.   Yes Historical Provider, MD  nitroGLYCERIN (NITROSTAT) 0.4 MG SL tablet Place 0.4 mg under the tongue every 5 (five) minutes as needed.     Yes Historical Provider, MD  omeprazole (PRILOSEC) 20 MG capsule Take 20 mg by mouth daily.   Yes Historical Provider, MD  PARoxetine (PAXIL-CR) 25 MG 24 hr tablet TAKE 1 TABLET (25 MG TOTAL) BY MOUTH EVERY MORNING. 10/04/13  Yes Lupita Leash, MD  pravastatin (PRAVACHOL) 80 MG tablet Take 1 tablet (80 mg total) by mouth daily. 08/09/12  Yes Antonieta Iba, MD  predniSONE (DELTASONE) 10 MG tablet Take 3  tablets (30 mg total) by mouth daily. 30 mg daily 10/04/13  Yes Lupita Leash, MD  PROAIR HFA 108 [phone] BASE) MCG/ACT inhaler INHALE 2 PUFFS INTO THE LUNGS EVERY 6 HOURS AS NEEDED 07/18/12  Yes Sherlene Shams, MD  sulfamethoxazole-trimethoprim (BACTRIM DS) 800-160 MG per tablet Take 1 tablet by mouth every Monday, Wednesday, and Friday. Take one tablet 3 times weekly. 10/04/13  Yes Lupita Leash, MD  tiotropium (SPIRIVA) 18 MCG inhalation capsule Place 1 capsule (18 mcg total) into inhaler and inhale daily. 10/04/13  Yes Lupita Leash, MD  tiZANidine (ZANAFLEX) 2 MG tablet TAKE 1 TABLET (2 MG TOTAL) BY MOUTH AT BEDTIME. 02/26/14  Yes Carollee Leitz, NP  valsartan (DIOVAN) 80 MG tablet TAKE 1 TABLET (80 MG TOTAL) BY MOUTH DAILY. 02/20/14  Yes Antonieta Iba, MD  ferrous gluconate (FERGON) 325 MG tablet Take 325 mg by mouth daily with breakfast.    Historical Provider, MD    Family History  Problem Relation Age of Onset  . Testicular cancer Brother   . Diabetes Mother      History  Substance Use Topics  . Smoking status: Current Every Day Smoker -- 0.75 packs/day for 33 years    Types: Cigarettes  . Smokeless tobacco: Never Used     Comment: .5-.75 pack/day  . Alcohol Use: No    Allergies as of 07/18/2014 - Review Complete 07/18/2014  Allergen Reaction Noted  . Clarithromycin    . Penicillins      Review of Systems:    All systems reviewed and negative except where noted in HPI.   Physical Exam:  BP 129/81 mmHg  Pulse 88  Temp(Src) 98.5 F (36.9 C) (Oral)  Ht 5\' 11"  (1.803 m)  Wt 156 lb (70.761 kg)  BMI 21.77 kg/m2 No LMP for male patient. Psych:  Alert and cooperative. Normal mood and affect. General:   Alert,  Well-developed, well-nourished, pleasant and cooperative in NAD Head:  Normocephalic and atraumatic. Eyes:  Sclera clear, no icterus.   Conjunctiva pink. Ears:  Normal auditory acuity. Nose:  No deformity, discharge, or lesions. Mouth:  No deformity or  lesions,oropharynx pink & moist. Neck:  Supple; no masses or thyromegaly. Lungs:  Respirations even and unlabored.  Diffuse crackles and rhonchi. Heart:  Regular rate and rhythm; no murmurs, clicks, rubs, or gallops. Abdomen:  Normal bowel sounds.  No bruits.  Soft, non-tender and non-distended without masses, hepatosplenomegaly or hernias noted.  No guarding or rebound tenderness.  Negative Carnett sign.   Rectal:  Deferred.  Msk:  Symmetrical without gross deformities.  Good, equal movement & strength bilaterally. Pulses:  Normal pulses noted. Extremities:  No clubbing or edema.  No cyanosis. Neurologic:  Alert and oriented x3;  grossly normal neurologically. Skin:  Intact without significant lesions or rashes.  No jaundice. Lymph Nodes:  No significant cervical adenopathy. Psych:  Alert and cooperative. Normal mood and affect.  Imaging Studies: No results found.  Assessment and Plan:   Dennis Barajas is a 54 y.o. y/o male who comes to me today after waiting many months to see a gastrologist in [place] who then canceled his appointment. The patient has isolated alkaline phosphatase with fractionation showing a higher bone percent then liver isoenzymes. The patient will have his blood sent off for GGT and anti-mitochondrial antibody. He will also have his liver enzymes checked again. Although the patient has increased phosphatase this may not be from a liver cause or may be from one of the multiple medications he is on. If the GGT is normal then it is unlikely that the patient's increased alkaline phosphatase is elevated from a liver source. The patient does report bone pain and arthritis with joint pain. The patient's ultrasound showing fatty liver is also unlikely the cause of his isolated increased alkaline phosphatase. She will be notified with the results of the blood work. The patient and his wife have been explained the plan and agree with it.

## 2014-07-19 LAB — HEPATIC FUNCTION PANEL
ALBUMIN: 4.3 g/dL (ref 3.5–5.5)
ALK PHOS: 196 IU/L — AB (ref 39–117)
ALT: 13 IU/L (ref 0–44)
AST: 15 IU/L (ref 0–40)
Bilirubin Total: <0.2 mg/dL (ref 0.0–1.2)
Bilirubin, Direct: 0.04 mg/dL (ref 0.00–0.40)
Total Protein: 7 g/dL (ref 6.0–8.5)

## 2014-07-19 LAB — MITOCHONDRIAL ANTIBODIES: MITOCHONDRIAL AB: 6.2 U (ref 0.0–20.0)

## 2014-07-19 LAB — GAMMA GT: GGT: 41 IU/L (ref 0–65)

## 2014-07-24 ENCOUNTER — Ambulatory Visit (INDEPENDENT_AMBULATORY_CARE_PROVIDER_SITE_OTHER): Payer: Commercial Managed Care - HMO | Admitting: Pulmonary Disease

## 2014-07-24 ENCOUNTER — Encounter: Payer: Self-pay | Admitting: Pulmonary Disease

## 2014-07-24 ENCOUNTER — Ambulatory Visit
Admission: RE | Admit: 2014-07-24 | Discharge: 2014-07-24 | Disposition: A | Discharge status: Home / Self Care | Payer: Commercial Managed Care - HMO | Source: Ambulatory Visit | Attending: Pulmonary Disease | Admitting: Pulmonary Disease

## 2014-07-24 VITALS — BP 118/66 | HR 82 | Ht 71.0 in | Wt 156.0 lb

## 2014-07-24 DIAGNOSIS — F17200 Nicotine dependence, unspecified, uncomplicated: Secondary | ICD-10-CM

## 2014-07-24 DIAGNOSIS — R634 Abnormal weight loss: Secondary | ICD-10-CM

## 2014-07-24 DIAGNOSIS — J849 Interstitial pulmonary disease, unspecified: Secondary | ICD-10-CM | POA: Diagnosis not present

## 2014-07-24 DIAGNOSIS — J8410 Pulmonary fibrosis, unspecified: Secondary | ICD-10-CM | POA: Diagnosis not present

## 2014-07-24 DIAGNOSIS — I517 Cardiomegaly: Secondary | ICD-10-CM | POA: Insufficient documentation

## 2014-07-24 DIAGNOSIS — R05 Cough: Secondary | ICD-10-CM

## 2014-07-24 DIAGNOSIS — R059 Cough, unspecified: Secondary | ICD-10-CM

## 2014-07-24 DIAGNOSIS — Z720 Tobacco use: Secondary | ICD-10-CM

## 2014-07-24 DIAGNOSIS — J432 Centrilobular emphysema: Secondary | ICD-10-CM | POA: Diagnosis not present

## 2014-07-24 DIAGNOSIS — R0602 Shortness of breath: Secondary | ICD-10-CM | POA: Diagnosis not present

## 2014-07-24 MED ORDER — PREDNISONE 20 MG PO TABS: 20.0000 mg | ORAL_TABLET | Freq: Every day | ORAL | Status: DC

## 2014-07-24 NOTE — Assessment & Plan Note (Signed)
His symptoms have increased slightly in the last several weeks, likely due to ongoing tobacco use.  Plan: Continue Advair and Spiriva Quit smoking

## 2014-07-24 NOTE — Patient Instructions (Signed)
Quit smoking Take prednisone 20 mg daily Keep taking Bactrim as prescribed We will call you with the results of the chest x-ray Try using ensure or boost shakes We will see you back in 3 months or sooner if needed

## 2014-07-24 NOTE — Assessment & Plan Note (Signed)
New problem, likely due to stress and decreased by mouth intake. However, considering his tobacco use when you get a chest x-ray to ensure there is nothing else going on. Plan: Chest x-ray Encouraged protein shakes

## 2014-07-24 NOTE — Progress Notes (Signed)
Subjective:    Patient ID: Dennis Barajas, male    DOB: 1960-11-28, 54 y.o.   MRN: [id number]  Synopsis: Diogo Anne first saw the [facility] pulmonary clinic in may of 2013 for shortness of breath. 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 He was also diagnosed with COPD and in 2011 his FEV1 was 53% predicted. He continues to use cigarettes on a regular basis. He previously was exposed to acetic acid when working at a [employer].  HPI  Chief Complaint  Patient presents with  . Follow-up    Pt c/o prod cough with white mucus, sinus congestion, sob with exertion.  States he often chokes while coughing.  CAT score 38   He says he is about to "choke to death." he thinks it is primarily from his chest.  He has some sinus congestion, he has some pressure in his sinuses, but he can't feel post nasal drip.  He doesn't blow his nose much. He says the mucus is always white in color, no fever or chills.  He says his breathing is difficult lately, worse with the hot weather.  He stays in the house all day.  He has not been taking any prednisone lately because it ran out. He is using a nicotine patch which has helped him cut back. He still smokes 10 cigarettes a day. He has been under a lot of stress at home lately.   He has been having some liver difficulty lately, some liver enzymes abnormal so he saw a hepatologist.  He has lost 10 pounds since last visit. He attributes this to not eating much. He has been under a lot of stress.   Past Medical History  Diagnosis Date  . COPD (chronic obstructive pulmonary disease)   . OSA (obstructive sleep apnea)   . Smoker   . Acute sinusitis   . Anxiety   . Hyperlipidemia   . Memory loss   . CAD (coronary artery disease) 2009    stent placement @ [facility]      Review of Systems  Constitutional: Positive for fatigue. Negative for fever, chills and diaphoresis.  HENT: Positive for  rhinorrhea and sinus pressure. Negative for congestion and postnasal drip.   Respiratory: Positive for cough and shortness of breath. Negative for chest tightness and wheezing.   Cardiovascular: Negative for chest pain and leg swelling.       Objective:   Physical Exam  Filed Vitals:   07/24/14 1022  BP: 118/66  Pulse: 82  Height:  (1.803 m)  Weight: 156 lb (70.761 kg)  SpO2: 94%  RA  Gen: moon facies, no acute distress HEENT: NCAT, EOMi, OP clear,  PULM: Inspiratory crackles in bases right greater than left, no wheezing CV: RRR, no mgr AB: BS+, soft, nontender Ext: warm, no edema, no clubbing, no cyanosis Derm: Thin skin over hands bilaterally, old scars, no active lesions  Recent primary care and gastroenterology notes reviewed where he was seen for abnormal liver function tests      Assessment & Plan:   COPD (chronic obstructive pulmonary disease) His symptoms have increased slightly in the last several weeks, likely due to ongoing tobacco use.  Plan: Continue Advair and Spiriva Quit smoking  DIP and RBILD Biopsy-proven, again, he refuses to try to help himself because he continues to keep smoking.  I explained to him that even if he does quit smoking at this point  he will likely be left with chronic lung scarring. Given his new liver disease our ability to use other medications such as methotrexate or Imuran are quite limited now.  Plan: Explained to him at length again today the importance of quitting smoking Continue prednisone at a moderate dose (20 mg) as this helps manage his symptoms Continue Bactrim along with prednisone for PCP prophylaxis   TOBACCO ABUSE Counseled again to quit smoking at length. Continue nicotine patches.  Loss of weight New problem, likely due to stress and decreased by mouth intake. However, considering his tobacco use when you get a chest x-ray to ensure there is nothing else going on. Plan: Chest x-ray Encouraged  protein shakes    Updated Medication List Outpatient Encounter Prescriptions as of 07/24/2014  Medication Sig  . albuterol (PROAIR HFA) 108 (90 BASE) MCG/ACT inhaler Inhale 2 puffs into the lungs every 6 (six) hours as needed.  Marland Kitchen. albuterol (PROVENTIL) (2.5 MG/3ML) 0.083% nebulizer solution Take 3 mLs (2.5 mg total) by nebulization every 6 (six) hours as needed. Dx 496  . aspirin 81 MG tablet Take 81 mg by mouth daily.    Marland Kitchen. buPROPion (WELLBUTRIN XL) 150 MG 24 hr tablet One tablet daily  . Diphenhydramine-APAP, sleep, (TYLENOL PM EXTRA STRENGTH PO) Take by mouth as needed.  . ergocalciferol (VITAMIN D2) 50000 UNITS capsule TAKE 1 CAPSULE (50,000 UNITS TOTAL) BY MOUTH ONCE A WEEK.  . ferrous gluconate (FERGON) 325 MG tablet Take 325 mg by mouth daily with breakfast.  . fluticasone (FLONASE) 50 MCG/ACT nasal spray PLACE 1 SPRAY INTO BOTH NOSTRILS DAILY.  . fluticasone-salmeterol (ADVAIR HFA) 115-21 MCG/ACT inhaler Inhale 2 puffs into the lungs as needed.  . hydrOXYzine (VISTARIL) 50 MG capsule Take 1 capsule (50 mg total) by mouth 3 (three) times daily as needed.  Marland Kitchen. lisinopril (PRINIVIL,ZESTRIL) 2.5 MG tablet Take 1 tablet (2.5 mg total) by mouth daily.  . niacin 500 MG tablet Take 500 mg by mouth daily with breakfast.  . nitroGLYCERIN (NITROSTAT) 0.4 MG SL tablet Place 0.4 mg under the tongue every 5 (five) minutes as needed.    Marland Kitchen. omeprazole (PRILOSEC) 20 MG capsule Take 20 mg by mouth daily.  Marland Kitchen. PARoxetine (PAXIL-CR) 25 MG 24 hr tablet TAKE 1 TABLET (25 MG TOTAL) BY MOUTH EVERY MORNING.  . pravastatin (PRAVACHOL) 80 MG tablet Take 1 tablet (80 mg total) by mouth daily.  . predniSONE (DELTASONE) 20 MG tablet Take 1 tablet (20 mg total) by mouth daily. 30 mg daily  . PROAIR HFA 108 (90 BASE) MCG/ACT inhaler INHALE 2 PUFFS INTO THE LUNGS EVERY 6 HOURS AS NEEDED  . sulfamethoxazole-trimethoprim (BACTRIM DS) 800-160 MG per tablet Take 1 tablet by mouth every Monday, Wednesday, and Friday. Take one  tablet 3 times weekly.  Marland Kitchen. tiotropium (SPIRIVA) 18 MCG inhalation capsule Place 1 capsule (18 mcg total) into inhaler and inhale daily.  Marland Kitchen. tiZANidine (ZANAFLEX) 2 MG tablet TAKE 1 TABLET (2 MG TOTAL) BY MOUTH AT BEDTIME.  . valsartan (DIOVAN) 80 MG tablet TAKE 1 TABLET (80 MG TOTAL) BY MOUTH DAILY.  . [DISCONTINUED] predniSONE (DELTASONE) 10 MG tablet Take 3 tablets (30 mg total) by mouth daily. 30 mg daily   No facility-administered encounter medications on file as of 07/24/2014.

## 2014-07-24 NOTE — Assessment & Plan Note (Signed)
Biopsy-proven, again, he refuses to try to help himself because he continues to keep smoking.  I explained to him that even if he does quit smoking at this point he will likely be left with chronic lung scarring. Given his new liver disease our ability to use other medications such as methotrexate or Imuran are quite limited now.  Plan: Explained to him at length again today the importance of quitting smoking Continue prednisone at a moderate dose (20 mg) as this helps manage his symptoms Continue Bactrim along with prednisone for PCP prophylaxis

## 2014-07-24 NOTE — Assessment & Plan Note (Signed)
Counseled again to quit smoking at length. Continue nicotine patches.

## 2014-07-25 ENCOUNTER — Telehealth: Payer: Self-pay | Admitting: Pulmonary Disease

## 2014-07-25 DIAGNOSIS — R0600 Dyspnea, unspecified: Secondary | ICD-10-CM

## 2014-07-25 DIAGNOSIS — I517 Cardiomegaly: Secondary | ICD-10-CM

## 2014-07-25 NOTE — Telephone Encounter (Signed)
Result Notes     Notes Recorded by Velvet BatheAshley L Caulfield, CMA on 07/24/2014 at 5:27 PM lmtcb X1 for Dennis Barajas to relay results/recs. Will order echo after speaking to Dennis Barajas. ------  Notes Recorded by Lupita Leashouglas B McQuaid, MD on 07/24/2014 at 12:46 PM A, Please explain to him that this showed that his heart size was a little big, but the ILD was about the same compared to before. He should have an echocardiogram to evaluate his heart (reason dyspnea, cardiomegally) Thanks B   Spoke with Dennis Barajas's wife, [name]. She is okay with us scheduling echocardiogram. Order will be placed. Nothing further was needed at this time.

## 2014-07-27 ENCOUNTER — Other Ambulatory Visit: Payer: Self-pay

## 2014-07-27 ENCOUNTER — Ambulatory Visit (INDEPENDENT_AMBULATORY_CARE_PROVIDER_SITE_OTHER): Payer: Commercial Managed Care - HMO

## 2014-07-27 DIAGNOSIS — R0600 Dyspnea, unspecified: Secondary | ICD-10-CM | POA: Diagnosis not present

## 2014-07-27 DIAGNOSIS — I517 Cardiomegaly: Secondary | ICD-10-CM | POA: Diagnosis not present

## 2014-07-27 NOTE — Telephone Encounter (Signed)
Pt notified. Pt will follow up with her PCP and will let us know if a liver biopsy will need to be done.

## 2014-07-27 NOTE — Telephone Encounter (Signed)
-----  Message from Lucilla Lame, MD sent at 07/26/2014 12:39 PM EDT ----- Dennis Barajas, Let the patient know that the blood test did not show any inflammation of his liver although his alkaline phosphatase is still elevated. If a bone source of the increased alkaline phosphatase is not found and the patient may need a liver biopsy to definitively rule out the liver as a source of his increased alk phosphatase.

## 2014-08-08 NOTE — Progress Notes (Signed)
Quick Note:  Pt's wife returned call. Informed her of the results and recs per BQ. Pt's wife verbalized understanding and stated she would relay the message to the pt and denied any further questions or concerns at this time.   ______

## 2014-09-10 ENCOUNTER — Telehealth: Payer: Self-pay | Admitting: Internal Medicine

## 2014-09-10 NOTE — Telephone Encounter (Signed)
Pt wife came in and stated that she needed FMLA form filled out. Please advise Jocob Dambach @ [phone] when ready. Placed in Dr.Tullos box.

## 2014-09-10 NOTE — Telephone Encounter (Signed)
Last OV with NP and patient no showed on 5/16 please advise as to [facility].

## 2014-09-10 NOTE — Telephone Encounter (Signed)
Denied,  Patient will need to have it done by Pulmonary since he sees them regularly and did not keep his last appt with me

## 2014-09-11 NOTE — Telephone Encounter (Signed)
Patient wife notified.

## 2014-09-11 NOTE — Telephone Encounter (Signed)
Left message for patient to return call to office have placed FMLA at front desk for pick up see note below MD denied.

## 2014-09-13 ENCOUNTER — Ambulatory Visit: Payer: Commercial Managed Care - HMO | Admitting: Family Medicine

## 2014-10-23 ENCOUNTER — Other Ambulatory Visit: Payer: Self-pay

## 2014-10-23 MED ORDER — ALBUTEROL SULFATE HFA 108 (90 BASE) MCG/ACT IN AERS: 2.0000 | INHALATION_SPRAY | Freq: Four times a day (QID) | RESPIRATORY_TRACT | Status: DC | PRN

## 2014-10-23 MED ORDER — FLUTICASONE-SALMETEROL 115-21 MCG/ACT IN AERO: 2.0000 | INHALATION_SPRAY | RESPIRATORY_TRACT | Status: DC | PRN

## 2014-11-01 ENCOUNTER — Encounter: Payer: Self-pay | Admitting: Internal Medicine

## 2014-11-01 ENCOUNTER — Ambulatory Visit (INDEPENDENT_AMBULATORY_CARE_PROVIDER_SITE_OTHER): Payer: Commercial Managed Care - HMO | Admitting: Internal Medicine

## 2014-11-01 VITALS — BP 110/62 | HR 106 | Ht 71.0 in | Wt 156.0 lb

## 2014-11-01 DIAGNOSIS — Z23 Encounter for immunization: Secondary | ICD-10-CM

## 2014-11-01 DIAGNOSIS — J449 Chronic obstructive pulmonary disease, unspecified: Secondary | ICD-10-CM | POA: Diagnosis not present

## 2014-11-01 DIAGNOSIS — G4733 Obstructive sleep apnea (adult) (pediatric): Secondary | ICD-10-CM

## 2014-11-01 MED ORDER — TIOTROPIUM BROMIDE MONOHYDRATE 18 MCG IN CAPS: 18.0000 ug | ORAL_CAPSULE | Freq: Every day | RESPIRATORY_TRACT | Status: DC

## 2014-11-01 MED ORDER — SULFAMETHOXAZOLE-TRIMETHOPRIM 800-160 MG PO TABS
ORAL_TABLET | ORAL | Status: DC
Start: 2014-11-01 — End: 2015-04-04

## 2014-11-01 MED ORDER — FLUTICASONE-SALMETEROL 115-21 MCG/ACT IN AERO: 2.0000 | INHALATION_SPRAY | RESPIRATORY_TRACT | Status: DC | PRN

## 2014-11-01 MED ORDER — ALBUTEROL SULFATE HFA 108 (90 BASE) MCG/ACT IN AERS: 2.0000 | INHALATION_SPRAY | Freq: Four times a day (QID) | RESPIRATORY_TRACT | Status: DC | PRN

## 2014-11-01 MED ORDER — PREDNISONE 20 MG PO TABS: 10.0000 mg | ORAL_TABLET | Freq: Every day | ORAL | Status: DC

## 2014-11-01 NOTE — Progress Notes (Signed)
  Subjective:    Patient ID: Dennis Barajas, male    DOB: 1960-12-18, 54 y.o.   MRN: [id number]  Synopsis: Morene RankinsJeffrey Faulstich first saw the [facility] pulmonary clinic in may of 2013 for shortness of breath. In 2009 he underwent an open lung biopsy [facility] which showed DIP and RB ILD. He was also diagnosed with COPD and in 2011 his FEV1 was 53% predicted. He continues to use cigarettes on a regular basis. 1 pack per day, patient continues to smoke despite aggressive counseling  HPI  Chief Complaint  Patient presents with  . Follow-up    ILD; prod cough w/clear/white mucus; dizziness;    patient with chronic SOB,DOE Uses CPAP for OSA Patient still smokes, has chronic productive cough no fevers, chills Complains of intermittent dizziness, no chest pain or focal neuro deficits, has occasional neck pain from fused vertebrae    Past Medical History  Diagnosis Date  . COPD (chronic obstructive pulmonary disease) (HCC)   . OSA (obstructive sleep apnea)   . Smoker   . Acute sinusitis   . Anxiety   . Hyperlipidemia   . Memory loss   . CAD (coronary artery disease) 2009    stent placement @ [facility]      Review of Systems  Constitutional: Positive for fatigue. Negative for fever, chills and diaphoresis.  HENT: Negative for congestion, postnasal drip, rhinorrhea and sinus pressure.   Respiratory: Positive for cough, shortness of breath and wheezing. Negative for chest tightness.   Cardiovascular: Negative for chest pain and leg swelling.  Neurological: Positive for dizziness and headaches. Negative for seizures, facial asymmetry, speech difficulty and numbness.       Objective:   Physical Exam  Constitutional: No distress.  Eyes: Conjunctivae are normal. Pupils are equal, round, and reactive to light.  Pulmonary/Chest: Effort normal. No respiratory distress. He has wheezes.  Skin: He is not diaphoretic.    Filed Vitals:   11/01/14 1103  BP: 110/62  Pulse: 106   Height: 5\' 11"  (1.803 m)  Weight: 156 lb (70.761 kg)  SpO2: 93%  RA      Assessment & Plan:  54 yo white male with Moderate COPD with DIP/RBILD with ongoing tobacco abuse  COPD (chronic obstructive pulmonary disease)-moderate Would like to obtain 6MWT and Overnight Pulse Oximetry to assess oxygen levels/needs  1.Continue Advair and Spiriva 2.albuterol as needed 3.will give flu shot this visit  DIP and RBILD Biopsy-proven, again, he refuses to try to help himself because he continues to keep smoking. 1.Explained to him at length again today the importance of quitting smoking 2.Continue prednisone at a moderate dose-will decrease to 10 mg daily  as this helps manage his symptoms 3.Continue Bactrim along with prednisone for PCP prophylaxis   TOBACCO ABUSE Counseled again to quit smoking at length. Continue nicotine patches.  OSA -refer to Dr. Nicholos Johnsamachandran to assess CPAP and titration -will set up for split night study  Follow up 3 months  I have personally obtained a history, examined the patient, evaluated Pertinent laboratory and RadioGraphic/imaging results, and  formulated the assessment and plan The Patient requires high complexity decision making for assessment and support, frequent evaluation and titration of therapies. Patient/Family are satisfied with Plan of action and management. All questions answered  Lucie LeatherKurian David Luisfelipe Engelstad, M.D.  [facility]  Medical Director [facility] Medical Director [facility] Cardio-Pulmonary Department

## 2014-11-01 NOTE — Patient Instructions (Addendum)
Chronic Obstructive Pulmonary Disease Chronic obstructive pulmonary disease (COPD) is a common lung condition in which airflow from the lungs is limited. COPD is a general term that can be used to describe many different lung problems that limit airflow, including both chronic bronchitis and emphysema. If you have COPD, your lung function will probably never return to normal, but there are measures you can take to improve lung function and make yourself feel better. CAUSES   Smoking (common).  Exposure to secondhand smoke.  Genetic problems.  Chronic inflammatory lung diseases or recurrent infections. SYMPTOMS  Shortness of breath, especially with physical activity.  Deep, persistent (chronic) cough with a large amount of thick mucus.  Wheezing.  Rapid breaths (tachypnea).  Gray or bluish discoloration (cyanosis) of the skin, especially in your fingers, toes, or lips.  Fatigue.  Weight loss.  Frequent infections or episodes when breathing symptoms become much worse (exacerbations).  Chest tightness. DIAGNOSIS Your health care provider will take a medical history and perform a physical examination to diagnose COPD. Additional tests for COPD may include:  Lung (pulmonary) function tests.  Chest X-ray.  CT scan.  Blood tests. TREATMENT  Treatment for COPD may include:  Inhaler and nebulizer medicines. These help manage the symptoms of COPD and make your breathing more comfortable.  Supplemental oxygen. Supplemental oxygen is only helpful if you have a low oxygen level in your blood.  Exercise and physical activity. These are beneficial for nearly all people with COPD.  Lung surgery or transplant.  Nutrition therapy to gain weight, if you are underweight.  Pulmonary rehabilitation. This may involve working with a team of health care providers and specialists, such as respiratory, occupational, and physical therapists. HOME CARE INSTRUCTIONS  Take all medicines  (inhaled or pills) as directed by your health care provider.  Avoid over-the-counter medicines or cough syrups that dry up your airway (such as antihistamines) and slow down the elimination of secretions unless instructed otherwise by your health care provider.  If you are a smoker, the most important thing that you can do is stop smoking. Continuing to smoke will cause further lung damage and breathing trouble. Ask your health care provider for help with quitting smoking. He or she can direct you to community resources or hospitals that provide support.  Avoid exposure to irritants such as smoke, chemicals, and fumes that aggravate your breathing.  Use oxygen therapy and pulmonary rehabilitation if directed by your health care provider. If you require home oxygen therapy, ask your health care provider whether you should purchase a pulse oximeter to measure your oxygen level at home.  Avoid contact with individuals who have a contagious illness.  Avoid extreme temperature and humidity changes.  Eat healthy foods. Eating smaller, more frequent meals and resting before meals may help you maintain your strength.  Stay active, but balance activity with periods of rest. Exercise and physical activity will help you maintain your ability to do things you want to do.  Preventing infection and hospitalization is very important when you have COPD. Make sure to receive all the vaccines your health care provider recommends, especially the pneumococcal and influenza vaccines. Ask your health care provider whether you need a pneumonia vaccine.  Learn and use relaxation techniques to manage stress.  Learn and use controlled breathing techniques as directed by your health care provider. Controlled breathing techniques include:  Pursed lip breathing. Start by breathing in (inhaling) through your nose for 1 second. Then, purse your lips as if you were   going to whistle and breathe out (exhale) through the  pursed lips for 2 seconds.  Diaphragmatic breathing. Start by putting one hand on your abdomen just above your waist. Inhale slowly through your nose. The hand on your abdomen should move out. Then purse your lips and exhale slowly. You should be able to feel the hand on your abdomen moving in as you exhale.  Learn and use controlled coughing to clear mucus from your lungs. Controlled coughing is a series of short, progressive coughs. The steps of controlled coughing are: 1. Lean your head slightly forward. 2. Breathe in deeply using diaphragmatic breathing. 3. Try to hold your breath for 3 seconds. 4. Keep your mouth slightly open while coughing twice. 5. Spit any mucus out into a tissue. 6. Rest and repeat the steps once or twice as needed. SEEK MEDICAL CARE IF:  You are coughing up more mucus than usual.  There is a change in the color or thickness of your mucus.  Your breathing is more labored than usual.  Your breathing is faster than usual. SEEK IMMEDIATE MEDICAL CARE IF:  You have shortness of breath while you are resting.  You have shortness of breath that prevents you from:  Being able to talk.  Performing your usual physical activities.  You have chest pain lasting longer than 5 minutes.  Your skin color is more cyanotic than usual.  You measure low oxygen saturations for longer than 5 minutes with a pulse oximeter. MAKE SURE YOU:  Understand these instructions.  Will watch your condition.  Will get help right away if you are not doing well or get worse.   This information is not intended to replace advice given to you by your health care provider. Make sure you discuss any questions you have with your health care provider.   Document Released: 10/15/2004 Document Revised: 01/26/2014 Document Reviewed: 09/01/2012 Elsevier Interactive Patient Education 2016 [employer]     PATIENT NEEDS 6MWT scheduled

## 2014-11-05 ENCOUNTER — Telehealth: Payer: Self-pay | Admitting: Internal Medicine

## 2014-11-05 ENCOUNTER — Encounter: Payer: Self-pay | Admitting: Internal Medicine

## 2014-11-05 DIAGNOSIS — J449 Chronic obstructive pulmonary disease, unspecified: Secondary | ICD-10-CM

## 2014-11-05 MED ORDER — PREDNISONE 10 MG PO TABS: 10.0000 mg | ORAL_TABLET | Freq: Every day | ORAL | Status: DC

## 2014-11-05 NOTE — Telephone Encounter (Signed)
Spoke with pharmacy. The prescription they received for the pt's prednisone had 2 sigs. This has been clarified per Dr. Clovis FredricksonKasa's OV note.  Nothing further was needed.

## 2014-11-06 ENCOUNTER — Encounter (INDEPENDENT_AMBULATORY_CARE_PROVIDER_SITE_OTHER): Payer: Commercial Managed Care - HMO | Admitting: Internal Medicine

## 2014-11-06 DIAGNOSIS — J8410 Pulmonary fibrosis, unspecified: Secondary | ICD-10-CM | POA: Diagnosis not present

## 2014-11-06 DIAGNOSIS — J432 Centrilobular emphysema: Secondary | ICD-10-CM

## 2014-11-06 NOTE — Progress Notes (Signed)
SMW performed today. 

## 2014-11-09 ENCOUNTER — Other Ambulatory Visit: Payer: Self-pay | Admitting: *Deleted

## 2014-11-09 DIAGNOSIS — J432 Centrilobular emphysema: Secondary | ICD-10-CM

## 2014-11-09 DIAGNOSIS — J8410 Pulmonary fibrosis, unspecified: Secondary | ICD-10-CM

## 2014-11-16 DIAGNOSIS — R0602 Shortness of breath: Secondary | ICD-10-CM | POA: Diagnosis not present

## 2014-11-16 DIAGNOSIS — J449 Chronic obstructive pulmonary disease, unspecified: Secondary | ICD-10-CM | POA: Diagnosis not present

## 2014-11-23 ENCOUNTER — Ambulatory Visit: Payer: Commercial Managed Care - HMO | Attending: Pulmonary Disease

## 2014-11-23 DIAGNOSIS — G4733 Obstructive sleep apnea (adult) (pediatric): Secondary | ICD-10-CM | POA: Insufficient documentation

## 2014-11-23 DIAGNOSIS — G4736 Sleep related hypoventilation in conditions classified elsewhere: Secondary | ICD-10-CM | POA: Insufficient documentation

## 2014-11-27 DIAGNOSIS — E781 Pure hyperglyceridemia: Secondary | ICD-10-CM | POA: Diagnosis not present

## 2014-11-27 DIAGNOSIS — F329 Major depressive disorder, single episode, unspecified: Secondary | ICD-10-CM | POA: Diagnosis not present

## 2014-11-27 DIAGNOSIS — Z125 Encounter for screening for malignant neoplasm of prostate: Secondary | ICD-10-CM | POA: Diagnosis not present

## 2014-11-27 DIAGNOSIS — J849 Interstitial pulmonary disease, unspecified: Secondary | ICD-10-CM | POA: Diagnosis not present

## 2014-11-27 DIAGNOSIS — I2510 Atherosclerotic heart disease of native coronary artery without angina pectoris: Secondary | ICD-10-CM | POA: Diagnosis not present

## 2014-11-27 DIAGNOSIS — I10 Essential (primary) hypertension: Secondary | ICD-10-CM | POA: Diagnosis not present

## 2014-11-27 DIAGNOSIS — Z9861 Coronary angioplasty status: Secondary | ICD-10-CM | POA: Diagnosis not present

## 2014-11-29 DIAGNOSIS — G4730 Sleep apnea, unspecified: Secondary | ICD-10-CM | POA: Diagnosis not present

## 2014-12-11 ENCOUNTER — Encounter: Payer: Self-pay | Admitting: Internal Medicine

## 2014-12-11 ENCOUNTER — Ambulatory Visit: Payer: Commercial Managed Care - HMO | Admitting: Internal Medicine

## 2014-12-11 ENCOUNTER — Ambulatory Visit (INDEPENDENT_AMBULATORY_CARE_PROVIDER_SITE_OTHER): Payer: Commercial Managed Care - HMO | Admitting: Internal Medicine

## 2014-12-11 VITALS — BP 126/68 | HR 82 | Ht 71.0 in | Wt 157.2 lb

## 2014-12-11 DIAGNOSIS — F1910 Other psychoactive substance abuse, uncomplicated: Secondary | ICD-10-CM

## 2014-12-11 DIAGNOSIS — G4733 Obstructive sleep apnea (adult) (pediatric): Secondary | ICD-10-CM

## 2014-12-11 DIAGNOSIS — Z720 Tobacco use: Secondary | ICD-10-CM

## 2014-12-11 NOTE — Patient Instructions (Addendum)
--  Set a quit date to stop smoking.  Continue with nicotine patches after that date, and in addition to that use either nicotine gum or lozenges whenever you get the urge to smoke.   --Autotitrating CPAP, low pressure 8, high pressure 14.    Sleep Apnea Sleep apnea is disorder that affects a person's sleep. A person with sleep apnea has abnormal pauses in their breathing when they sleep. It is hard for them to get a good sleep. This makes a person tired during the day. It also can lead to other physical problems. There are three types of sleep apnea. One type is when breathing stops for a short time because your airway is blocked (obstructive sleep apnea). Another type is when the brain sometimes fails to give the normal signal to breathe to the muscles that control your breathing (central sleep apnea). The third type is a combination of the other two types. HOME CARE   Take all medicine as told by your doctor.  Avoid alcohol, calming medicines (sedatives), and depressant drugs.  Try to lose weight if you are overweight. Talk to your doctor about a healthy weight goal.  Your doctor may have you use a device that helps to open your airway. It can help you get the air that you need. It is called a positive airway pressure (PAP) device.   MAKE SURE YOU:   Understand these instructions.  Will watch your condition.  Will get help right away if you are not doing well or get worse.  It may take approximately 1 month for you to get used to wearing her CPAP every night.  Be sure to work with

## 2014-12-11 NOTE — Progress Notes (Signed)
[facility] Medicine Consultation      Assessment and Plan:  Obstructive sleep apnea. -He had been doing very well with his previous CPAP until it broke. We will order a auto titrating CPAP, and set him up with a new supplier for a new machine.  Cognitive dysfunction with memory loss. -Mild memory loss and difficulty concentrating due to obstructive sleep apnea.  Coronary artery disease.  COPD. -We'll also perform an overnight oximetry on CPAP. The patient will subsequently continue to follow-up with Dr. Belia Heman for his respiratory disease.  Nicotine abuse. -Discussed the importance of smoking cessation, and various strategies to quit. I recommended that he continue the patches. His wife is also a smoker, I asked them to pick a quit date which is of some special significance to them, such as an anniversary, which may be coming up. They should stop smoking, on that day. He should continue with the nicotine patches, and use either lozenges or nicotine gum when he has urges to smoke.  Date: 12/11/2014  MRN# [id number] Dennis Barajas 1960/06/27  Referring Physician: Dr. Belia Heman.   Dennis Barajas is a 54 y.o. old male seen in consultation for chief complaint of:    Chief Complaint  Patient presents with  . SLEEP CONSULT    pt. ref. by dr. Belia Heman. pt. states he was wearing CPAP 8hr everynight until it broke x6wk. increased daytime sleepiness and loud snoring. 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 He was also diagnosed with COPD and in 2011 his FEV1 was 53% predicted.  He follows with Dr. Belia Heman, in our clinic for the above problems. He is referred here today because of a history of obstructive sleep apnea. He's been on a CPAP of 10, however, his machine is broken.  He originally started started on cpap in 2009, and he has been using it since then. He recently got an error message on his machine, and it stopped  working. He went back to the home care company but they are no longer in Network. His insurance changed from [employer] to [employer]. He underwent another sleep study to re-qualify ordered by Dr. Belia Heman. This was positive for OSA.   He is feeling exhausted, he falls asleep easily. His wife is here also and notes that he is snoring and has apneic episodes.   Goes to bed between 6 to 10 pm since he stopped using cpap, because he is so sleepy.   Review of testing Sleep study on 11/23/2014 showed mild sleep apnea with an apnea apnea-hypopnea index of 15.    SIX MIN WALK 11/06/2014 10/23/2011 06/05/2011  Medications Albuterol Inhaler at 12pm - -  Supplimental Oxygen during Test? (L/min) No No No  Laps 6 - -  Partial Lap (in Meters) 0 - -  Baseline BP (sitting) 134/76 - -  Baseline Heartrate 88 - -  Baseline Dyspnea (Borg Scale) 3 - -  Baseline Fatigue (Borg Scale) 3 - -  Baseline SPO2 92 - -  BP (sitting) 144/86 - -  Heartrate 118 - -  Dyspnea (Borg Scale) 3 - -  Fatigue (Borg Scale) 3 - -  SPO2 86 - -  BP (sitting) 152/90 - -  Heartrate 107 - -  SPO2 92 - -  Stopped or Paused before Six Minutes No - -  Distance Completed 288 - -  Tech Comments: Pt was light headed when we started.  Got leg cramps in both legs after 3.5 minutes. After walking 300 ft o2 sat was 88% ra, walked an additional 100 ft and o2 was 87%ra and pulse 111- after resting approx 1 min, o2 sat increased to 91%ra. Pt declined o2 start at this time//lmr After walking 500 ft sats were 91%ra and pulse 122//lmr    PMHX:   Past Medical History  Diagnosis Date  . COPD (chronic obstructive pulmonary disease) (HCC)   . OSA (obstructive sleep apnea)   . Smoker   . Acute sinusitis   . Anxiety   . Hyperlipidemia   . Memory loss   . CAD (coronary artery disease) 2009    stent placement @ [facility]   Surgical Hx:  Past Surgical History  Procedure Laterality Date  . Cholecystectomy    . Appendectomy    . Knee surgery       bilateral knee surgery  . Cardiac catheterization  2009    stent placed @ [facility]   . Lung biopsy      x 2    Family Hx:  Family History  Problem Relation Age of Onset  . Testicular cancer Brother   . Diabetes Mother    Social Hx:   Social History  Substance Use Topics  . Smoking status: Current Every Day Smoker -- 0.75 packs/day for 33 years    Types: Cigarettes  . Smokeless tobacco: Never Used     Comment: .5-.75 pack/day  . Alcohol Use: No   Medication:   Current Outpatient Rx  Name  Route  Sig  Dispense  Refill  . albuterol (PROAIR HFA) 108 (90 BASE) MCG/ACT inhaler   Inhalation   Inhale 2 puffs into the lungs every 6 (six) hours as needed.   3 Inhaler   3   . albuterol (PROVENTIL) (2.5 MG/3ML) 0.083% nebulizer solution   Nebulization   Take 3 mLs (2.5 mg total) by nebulization every 6 (six) hours as needed. Dx 496   360 mL   1   . aspirin 81 MG tablet   Oral   Take 81 mg by mouth daily.           Marland Kitchen buPROPion (WELLBUTRIN XL) 150 MG 24 hr tablet      One tablet daily   60 tablet   0   . cholecalciferol (VITAMIN D) 1000 UNITS tablet   Oral   Take 1,000 Units by mouth daily.         . Diphenhydramine-APAP, sleep, (TYLENOL PM EXTRA STRENGTH PO)   Oral   Take by mouth as needed.         . ferrous gluconate (FERGON) 325 MG tablet   Oral   Take 325 mg by mouth daily with breakfast.         . fluticasone-salmeterol (ADVAIR HFA) 115-21 MCG/ACT inhaler   Inhalation   Inhale 2 puffs into the lungs as needed.   1 Inhaler   5   . hydrOXYzine (VISTARIL) 50 MG capsule   Oral   Take 1 capsule (50 mg total) by mouth 3 (three) times daily as needed.   90 capsule   11   . lisinopril (PRINIVIL,ZESTRIL) 2.5 MG tablet   Oral   Take 1 tablet (2.5 mg total) by mouth daily.   90 tablet   3   . nitroGLYCERIN (NITROSTAT) 0.4 MG SL tablet   Sublingual   Place 0.4 mg under the tongue every 5 (five) minutes as needed.           Marland Kitchen  omeprazole (PRILOSEC) 20 MG  capsule   Oral   Take 20 mg by mouth daily.         Marland Kitchen. PARoxetine (PAXIL-CR) 25 MG 24 hr tablet      TAKE 1 TABLET (25 MG TOTAL) BY MOUTH EVERY MORNING.   30 tablet   0     30 days only, no more refills until he is seen   . pravastatin (PRAVACHOL) 80 MG tablet   Oral   Take 1 tablet (80 mg total) by mouth daily.   90 tablet   3   . predniSONE (DELTASONE) 10 MG tablet   Oral   Take 1 tablet (10 mg total) by mouth daily with breakfast.   30 tablet   2   . sulfamethoxazole-trimethoprim (BACTRIM DS) 800-160 MG per tablet   Oral   Take 1 tablet by mouth every Monday, Wednesday, and Friday. Take one tablet 3 times weekly.   40 tablet   4   . sulfamethoxazole-trimethoprim (BACTRIM DS,SEPTRA DS) 800-160 MG tablet      Take 1 tablet Every Monday, Wednesday and Friday   30 tablet   2   . tiotropium (SPIRIVA) 18 MCG inhalation capsule   Inhalation   Place 1 capsule (18 mcg total) into inhaler and inhale daily.   30 capsule   5   . valsartan (DIOVAN) 80 MG tablet      TAKE 1 TABLET (80 MG TOTAL) BY MOUTH DAILY.   90 tablet   3     Please have patient contact our office to schedule ...       Allergies:  Clarithromycin and Penicillins  Review of Systems: Gen:  Denies  fever, sweats, chills HEENT: Denies blurred vision, double vision.  Cvc:  No dizziness, chest pain. Resp:   Denies hemoptysis.  Gi: Denies swallowing difficulty, stomach pain. Gu:  Denies bladder incontinence, burning urine Ext:   No Joint pain, stiffness. Skin: No skin rash,  hives Endoc:  No polyuria, polydipsia. Psych: No depression, insomnia. Other:  All other systems were reviewed with the patient and were negative other that what is mentioned in the HPI.   Physical Examination:   VS: There were no vitals taken for this visit.  General Appearance: No distress  Neuro:without focal findings,  speech normal,  HEENT: PERRLA, EOM intact.   Pulmonary: bilateral wheezing and crackles.    CardiovascularNormal S1,S2.  No m/r/g.   Abdomen: Benign, Soft, non-tender. Renal:  No costovertebral tenderness  GU:  No performed at this time. Endoc: No evident thyromegaly, no signs of acromegaly. Skin:   warm, no rashes, no ecchymosis  Extremities: normal, no cyanosis, clubbing.  Other findings:    LABORATORY PANEL:   CBC No results for input(s): WBC, HGB, HCT, PLT in the last 168 hours. ------------------------------------------------------------------------------------------------------------------  Chemistries  No results for input(s): NA, K, CL, CO2, GLUCOSE, BUN, CREATININE, CALCIUM, MG, AST, ALT, ALKPHOS, BILITOT in the last 168 hours.  Invalid input(s): GFRCGP ------------------------------------------------------------------------------------------------------------------  Cardiac Enzymes No results for input(s): TROPONINI in the last 168 hours. ------------------------------------------------------------  RADIOLOGY:  No results found.     Thank  you for the consultation and for allowing [facility] to assist in the care of your patient. Our recommendations are noted above.  Please contact us if we can be of further service.   Wells Guileseep Rakwon Letourneau, MD.  Board Certified in Internal Medicine, Pulmonary Medicine, Critical Care Medicine, and Sleep Medicine.  [facility] Office Number:  [phone]  Santiago Glad, M.D.  Stephanie Acre, M.D.  Billy Fischer, M.D

## 2014-12-17 DIAGNOSIS — R0602 Shortness of breath: Secondary | ICD-10-CM | POA: Diagnosis not present

## 2014-12-17 DIAGNOSIS — J449 Chronic obstructive pulmonary disease, unspecified: Secondary | ICD-10-CM | POA: Diagnosis not present

## 2014-12-21 ENCOUNTER — Telehealth: Payer: Self-pay | Admitting: Internal Medicine

## 2014-12-21 NOTE — Telephone Encounter (Signed)
Pt's spouse, Lacy DuverneySharon Stukey calling requesting status of order on CPAP machine. Pt was seen 12/11/14. At that time, pt had been using his CPAP machine, however, CPAP machine broke about 6 wks before this visit.  Pt had Sleep Study and is needing CPAP machine.  Please place order for CPAP [facility]  Please advise. Rhonda J Cobb

## 2014-12-24 NOTE — Telephone Encounter (Signed)
Will need results of CPAP titration study

## 2014-12-24 NOTE — Telephone Encounter (Signed)
Per Dr. Nicholos Johnsamachandran, Dennis Barajas was to have a CPAP Titration Study. Order was faxed to [facility] (confirmation received) on 12/17/14. Called [facility] and Dennis Barajas has not been scheduled for that study. Re-faxed order along with confirmation of first order to [facility].  Called and spoke with Dennis Barajas's wife and advised that Dennis Barajas will need CPAP Titration Study before we can order CPAP. Dennis Barajas's wife voiced understanding and is aware that once this study has been completed, then we can order CPAP. Nothing else needed at this time. Rhonda J Cobb

## 2015-01-03 ENCOUNTER — Telehealth: Payer: Self-pay | Admitting: *Deleted

## 2015-01-03 DIAGNOSIS — G4733 Obstructive sleep apnea (adult) (pediatric): Secondary | ICD-10-CM

## 2015-01-03 NOTE — Telephone Encounter (Signed)
Order placed for new CPAP

## 2015-01-03 NOTE — Telephone Encounter (Signed)
Order placed for new CPAP. Nothing further needed.  °

## 2015-01-03 NOTE — Telephone Encounter (Signed)
Orders placed for new CPAP

## 2015-01-10 DIAGNOSIS — G4733 Obstructive sleep apnea (adult) (pediatric): Secondary | ICD-10-CM | POA: Diagnosis not present

## 2015-01-10 DIAGNOSIS — R0602 Shortness of breath: Secondary | ICD-10-CM | POA: Diagnosis not present

## 2015-01-10 DIAGNOSIS — J449 Chronic obstructive pulmonary disease, unspecified: Secondary | ICD-10-CM | POA: Diagnosis not present

## 2015-01-10 DIAGNOSIS — J984 Other disorders of lung: Secondary | ICD-10-CM | POA: Diagnosis not present

## 2015-01-11 ENCOUNTER — Encounter: Payer: Self-pay | Admitting: Internal Medicine

## 2015-01-16 DIAGNOSIS — J449 Chronic obstructive pulmonary disease, unspecified: Secondary | ICD-10-CM | POA: Diagnosis not present

## 2015-01-16 DIAGNOSIS — R0602 Shortness of breath: Secondary | ICD-10-CM | POA: Diagnosis not present

## 2015-01-24 DIAGNOSIS — E781 Pure hyperglyceridemia: Secondary | ICD-10-CM | POA: Diagnosis not present

## 2015-01-24 DIAGNOSIS — I10 Essential (primary) hypertension: Secondary | ICD-10-CM | POA: Diagnosis not present

## 2015-01-24 DIAGNOSIS — F325 Major depressive disorder, single episode, in full remission: Secondary | ICD-10-CM | POA: Diagnosis not present

## 2015-02-04 ENCOUNTER — Ambulatory Visit (INDEPENDENT_AMBULATORY_CARE_PROVIDER_SITE_OTHER): Payer: Commercial Managed Care - HMO | Admitting: Internal Medicine

## 2015-02-04 ENCOUNTER — Encounter: Payer: Self-pay | Admitting: Internal Medicine

## 2015-02-04 VITALS — BP 132/68 | HR 74 | Ht 71.0 in | Wt 150.4 lb

## 2015-02-04 DIAGNOSIS — G4733 Obstructive sleep apnea (adult) (pediatric): Secondary | ICD-10-CM | POA: Diagnosis not present

## 2015-02-04 DIAGNOSIS — J449 Chronic obstructive pulmonary disease, unspecified: Secondary | ICD-10-CM | POA: Diagnosis not present

## 2015-02-04 DIAGNOSIS — J441 Chronic obstructive pulmonary disease with (acute) exacerbation: Secondary | ICD-10-CM

## 2015-02-04 DIAGNOSIS — R059 Cough, unspecified: Secondary | ICD-10-CM

## 2015-02-04 DIAGNOSIS — R05 Cough: Secondary | ICD-10-CM

## 2015-02-04 MED ORDER — IPRATROPIUM BROMIDE 0.03 % NA SOLN: 2.0000 | Freq: Four times a day (QID) | NASAL | Status: DC

## 2015-02-04 MED ORDER — GUAIFENESIN-CODEINE 100-10 MG/5ML PO SYRP: 5.0000 mL | ORAL_SOLUTION | Freq: Three times a day (TID) | ORAL | Status: DC | PRN

## 2015-02-04 MED ORDER — PREDNISONE 50 MG PO TABS
ORAL_TABLET | ORAL | Status: DC
Start: 2015-02-04 — End: 2015-02-21

## 2015-02-04 MED ORDER — LEVOFLOXACIN 500 MG PO TABS: 500.0000 mg | ORAL_TABLET | Freq: Every day | ORAL | Status: AC

## 2015-02-04 NOTE — Patient Instructions (Signed)
Chronic Obstructive Pulmonary Disease Chronic obstructive pulmonary disease (COPD) is a common lung condition in which airflow from the lungs is limited. COPD is a general term that can be used to describe many different lung problems that limit airflow, including both chronic bronchitis and emphysema. If you have COPD, your lung function will probably never return to normal, but there are measures you can take to improve lung function and make yourself feel better. CAUSES   Smoking (common).  Exposure to secondhand smoke.  Genetic problems.  Chronic inflammatory lung diseases or recurrent infections. SYMPTOMS  Shortness of breath, especially with physical activity.  Deep, persistent (chronic) cough with a large amount of thick mucus.  Wheezing.  Rapid breaths (tachypnea).  Gray or bluish discoloration (cyanosis) of the skin, especially in your fingers, toes, or lips.  Fatigue.  Weight loss.  Frequent infections or episodes when breathing symptoms become much worse (exacerbations).  Chest tightness. DIAGNOSIS Your health care provider will take a medical history and perform a physical examination to diagnose COPD. Additional tests for COPD may include:  Lung (pulmonary) function tests.  Chest X-ray.  CT scan.  Blood tests. TREATMENT  Treatment for COPD may include:  Inhaler and nebulizer medicines. These help manage the symptoms of COPD and make your breathing more comfortable.  Supplemental oxygen. Supplemental oxygen is only helpful if you have a low oxygen level in your blood.  Exercise and physical activity. These are beneficial for nearly all people with COPD.  Lung surgery or transplant.  Nutrition therapy to gain weight, if you are underweight.  Pulmonary rehabilitation. This may involve working with a team of health care providers and specialists, such as respiratory, occupational, and physical therapists. HOME CARE INSTRUCTIONS  Take all medicines  (inhaled or pills) as directed by your health care provider.  Avoid over-the-counter medicines or cough syrups that dry up your airway (such as antihistamines) and slow down the elimination of secretions unless instructed otherwise by your health care provider.  If you are a smoker, the most important thing that you can do is stop smoking. Continuing to smoke will cause further lung damage and breathing trouble. Ask your health care provider for help with quitting smoking. He or she can direct you to community resources or hospitals that provide support.  Avoid exposure to irritants such as smoke, chemicals, and fumes that aggravate your breathing.  Use oxygen therapy and pulmonary rehabilitation if directed by your health care provider. If you require home oxygen therapy, ask your health care provider whether you should purchase a pulse oximeter to measure your oxygen level at home.  Avoid contact with individuals who have a contagious illness.  Avoid extreme temperature and humidity changes.  Eat healthy foods. Eating smaller, more frequent meals and resting before meals may help you maintain your strength.  Stay active, but balance activity with periods of rest. Exercise and physical activity will help you maintain your ability to do things you want to do.  Preventing infection and hospitalization is very important when you have COPD. Make sure to receive all the vaccines your health care provider recommends, especially the pneumococcal and influenza vaccines. Ask your health care provider whether you need a pneumonia vaccine.  Learn and use relaxation techniques to manage stress.  Learn and use controlled breathing techniques as directed by your health care provider. Controlled breathing techniques include:  Pursed lip breathing. Start by breathing in (inhaling) through your nose for 1 second. Then, purse your lips as if you were   going to whistle and breathe out (exhale) through the  pursed lips for 2 seconds.  Diaphragmatic breathing. Start by putting one hand on your abdomen just above your waist. Inhale slowly through your nose. The hand on your abdomen should move out. Then purse your lips and exhale slowly. You should be able to feel the hand on your abdomen moving in as you exhale.  Learn and use controlled coughing to clear mucus from your lungs. Controlled coughing is a series of short, progressive coughs. The steps of controlled coughing are: 1. Lean your head slightly forward. 2. Breathe in deeply using diaphragmatic breathing. 3. Try to hold your breath for 3 seconds. 4. Keep your mouth slightly open while coughing twice. 5. Spit any mucus out into a tissue. 6. Rest and repeat the steps once or twice as needed. SEEK MEDICAL CARE IF:  You are coughing up more mucus than usual.  There is a change in the color or thickness of your mucus.  Your breathing is more labored than usual.  Your breathing is faster than usual. SEEK IMMEDIATE MEDICAL CARE IF:  You have shortness of breath while you are resting.  You have shortness of breath that prevents you from:  Being able to talk.  Performing your usual physical activities.  You have chest pain lasting longer than 5 minutes.  Your skin color is more cyanotic than usual.  You measure low oxygen saturations for longer than 5 minutes with a pulse oximeter. MAKE SURE YOU:  Understand these instructions.  Will watch your condition.  Will get help right away if you are not doing well or get worse.   This information is not intended to replace advice given to you by your health care provider. Make sure you discuss any questions you have with your health care provider.   Document Released: 10/15/2004 Document Revised: 01/26/2014 Document Reviewed: 09/01/2012 Elsevier Interactive Patient Education 2016 [employer]  

## 2015-02-04 NOTE — Progress Notes (Signed)
  Subjective:    Patient ID: Dennis Barajas, male    DOB: 1960/09/26, 55 y.o.   MRN: [id number]  Synopsis: Dennis Barajas first saw the [facility] pulmonary [facility] in may of 2013 for shortness of breath. In 2009 he underwent an [facility] lung [facility] which showed DIP and RB ILD.  He was also diagnosed with COPD and in 2011 his FEV1 was 53% predicted. He continues to use cigarettes on a regular basis. 1 pack per day, patient continues to smoke despite aggressive counseling Uses oxygen with exertion  NW:GNFAOCC:sinus congestion, cough CPAP machine not working   HPI  patient with chronic SOB,DOE, patient continues to smoke Uses CPAP for OSA-states not getting enough air Patient with sinus drainage and tenderness Uses oxygen with exertion      Review of Systems  Constitutional: Positive for fatigue. Negative for fever, chills and diaphoresis.  HENT: Positive for congestion, postnasal drip, rhinorrhea and sinus pressure.   Respiratory: Positive for cough, shortness of breath and wheezing. Negative for chest tightness.   Cardiovascular: Negative for chest pain and leg swelling.  Neurological: Positive for dizziness and headaches. Negative for seizures, facial asymmetry, speech difficulty and numbness.       Objective:   Physical Exam  Constitutional: No distress.  HENT:  +facial pain/sinus tenderness  Eyes: Conjunctivae are normal. Pupils are equal, round, and reactive to light.  Pulmonary/Chest: Effort normal. No respiratory distress. He has wheezes.  Skin: He is not diaphoretic.    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 1.Explained to him at length again today the importance of quitting smoking 2.Continue prednisone at a moderate dose-will decrease to 10 mg daily  as this helps manage his symptoms 3.Continue Bactrim along with prednisone for PCP prophylaxis   TOBACCO ABUSE Counseled again to quit smoking at length.   OSA -will need to re-assess CAPA/Auto pap machine -patient states not getting enough air -follow up on overnight pulse oximetry  Follow up 1 months-will repeat PFT's in 3-4 months  I have personally obtained a history, examined the patient, evaluated Pertinent laboratory and RadioGraphic/imaging results, and  formulated the assessment and plan The Patient requires high complexity decision making for assessment and support, frequent evaluation and titration of therapies. Patient/Family are satisfied with Plan of action and management. All questions answered  Lucie LeatherKurian David Charlotte Brafford, M.D.  [facility]  Medical Director [facility] Medical Director [facility] Cardio-Pulmonary Department

## 2015-02-04 NOTE — Addendum Note (Signed)
Addended by: Meyer CoryAHMAD, MISTY R on: 02/04/2015 11:19 AM   Modules accepted: Orders

## 2015-02-04 NOTE — Progress Notes (Signed)
Subjective:    Patient ID: Dennis Barajas, male    DOB: Nov 19, 1960, 55 y.o.   MRN: [id number]  Synopsis: Dennis Barajas first saw the [facility] in may of 2013 for shortness of breath. In 2009 he underwent an open lung biopsy at [facility] which showed DIP and RB ILD. He was also diagnosed with COPD and in 2011 his FEV1 was 53% predicted. He continues to use cigarettes on a regular basis. 1 pack per day, patient continues to smoke despite aggressive counseling  HPI  Chief Complaint  Patient presents with  . Follow-up    pt. states breathing has worsen. pain in rt. lung due to congestion. prod. cough clear in color. wheezing. occ. chest pain.    patient with chronic SOB,DOE Uses CPAP for OSA Patient still smokes, has chronic productive cough no fevers, chills Complains of intermittent dizziness, no chest pain or focal neuro deficits, has occasional neck pain from fused vertebrae    Past Medical History  Diagnosis Date  . COPD (chronic obstructive pulmonary disease) (HCC)   . OSA (obstructive sleep apnea)   . Smoker   . Acute sinusitis   . Anxiety   . Hyperlipidemia   . Memory loss   . CAD (coronary artery disease) 2009    stent placement @ [facility]      Review of Systems  Constitutional: Positive for fatigue. Negative for fever, chills and diaphoresis.  HENT: Negative for congestion, postnasal drip, rhinorrhea and sinus pressure.   Respiratory: Positive for cough, shortness of breath and wheezing. Negative for chest tightness.   Cardiovascular: Negative for chest pain and leg swelling.  Neurological: Positive for dizziness and headaches. Negative for seizures, facial asymmetry, speech difficulty and numbness.       Objective:   Physical Exam  Constitutional: No distress.  Eyes: Conjunctivae are normal. Pupils are equal, round, and reactive to light.  Pulmonary/Chest: Effort normal. No respiratory distress. He has wheezes.  Skin: He is not  diaphoretic.    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 1.Explained to him at length again today the importance of quitting smoking 2.Continue prednisone at a moderate dose-will decrease to 10 mg daily  as this helps manage his symptoms 3.Continue Bactrim along with prednisone for PCP prophylaxis   TOBACCO ABUSE Counseled again to quit smoking at length. Continue nicotine patches.  OSA -refer to Dr. Nicholos Johnsamachandran to assess CPAP and titration -will set up for split night study  Follow up 3 months  I have personally obtained a history, examined the patient, evaluated Pertinent laboratory and RadioGraphic/imaging results, and  formulated the assessment and plan The Patient requires high complexity decision making for assessment and support, frequent evaluation and titration of therapies. Patient/Family are satisfied with Plan of action and management. All questions answered  Lucie LeatherKurian David Elouise Divelbiss, M.D.  [facility]  Medical Director [facility] Medical Director [facility] Cardio-Pulmonary  Department

## 2015-02-04 NOTE — Addendum Note (Signed)
Addended by: Meyer CoryAHMAD, Kastin Cerda R on: 02/04/2015 10:47 AM   Modules accepted: Orders

## 2015-02-10 DIAGNOSIS — R0602 Shortness of breath: Secondary | ICD-10-CM | POA: Diagnosis not present

## 2015-02-10 DIAGNOSIS — J984 Other disorders of lung: Secondary | ICD-10-CM | POA: Diagnosis not present

## 2015-02-10 DIAGNOSIS — J449 Chronic obstructive pulmonary disease, unspecified: Secondary | ICD-10-CM | POA: Diagnosis not present

## 2015-02-10 DIAGNOSIS — G4733 Obstructive sleep apnea (adult) (pediatric): Secondary | ICD-10-CM | POA: Diagnosis not present

## 2015-02-16 DIAGNOSIS — J449 Chronic obstructive pulmonary disease, unspecified: Secondary | ICD-10-CM | POA: Diagnosis not present

## 2015-02-16 DIAGNOSIS — R0602 Shortness of breath: Secondary | ICD-10-CM | POA: Diagnosis not present

## 2015-02-21 ENCOUNTER — Encounter: Payer: Self-pay | Admitting: Internal Medicine

## 2015-02-21 ENCOUNTER — Ambulatory Visit (INDEPENDENT_AMBULATORY_CARE_PROVIDER_SITE_OTHER): Payer: Commercial Managed Care - HMO | Admitting: Internal Medicine

## 2015-02-21 VITALS — BP 126/76 | HR 100

## 2015-02-21 DIAGNOSIS — G4733 Obstructive sleep apnea (adult) (pediatric): Secondary | ICD-10-CM

## 2015-02-21 MED ORDER — ROFLUMILAST 500 MCG PO TABS: 500.0000 ug | ORAL_TABLET | Freq: Every day | ORAL | Status: DC

## 2015-02-21 NOTE — Progress Notes (Signed)
  Subjective:    Patient ID: Dennis Barajas, male    DOB: [birth year], 55 y.o.   MRN: [id number]  Synopsis: Nazim Kadlec first saw the [facility] pulmonary clinic in may of 2013 for shortness of breath. In 2009 he underwent an open lung biopsy at [facility] which showed DIP and RB ILD.  He was also diagnosed with COPD and in 2011 his FEV1 was 53% predicted. He continues to use cigarettes on a regular basis. 1 pack per day, patient continues to smoke despite aggressive counseling Uses oxygen with exertion  CC:SOB,DOE CPAP machine not working Follow up for COPD exacerbation  HPI  patient with chronic SOB,DOE, patient continues to smoke, patient feels a lot better having had 50 mg prednisone for last 2 weeks Uses CPAP for OSA-settings have been adjusted and he feels better, Pressure increased to 16 Uses oxygen with exertion No signs of infection at this time   Review of Systems  Constitutional: Positive for fatigue. Negative for fever, chills and diaphoresis.  HENT: Positive for congestion, postnasal drip, rhinorrhea and sinus pressure.   Respiratory: Positive for shortness of breath. Negative for cough, chest tightness and wheezing.   Cardiovascular: Negative for chest pain and leg swelling.  Neurological: Positive for dizziness and numbness. Negative for seizures, facial asymmetry, speech difficulty and headaches.       Objective:   Physical Exam  Constitutional: No distress.  HENT:  +facial pain/sinus tenderness  Eyes: Conjunctivae are normal. Pupils are equal, round, and reactive to light.  Pulmonary/Chest: Effort normal. No respiratory distress. He has wheezes.  Skin: He is not diaphoretic.   BP 126/76 mmHg  Pulse 100  SpO2 90% Placed on 2 L [place]    Assessment & Plan:  54 yo white male with Moderate COPD with DIP/RBILD with ongoing tobacco abuse with follow up acute Sinusitis with acute mild COPD exacerbation, patient feels much better having been on high dose  of prednisone   COPD (chronic obstructive pulmonary disease)-moderate grade D-continues to smoke  1.will decrease prednisone back down to 10 mg daily as prescribed before  2.will start Daliresp and attempt to wean off prednisone 3.atrovent for rhinitis 4.robitussin with codeine for cough as needed 5.patient will need ENT evaluation if sinusitis persisits 6.oxygen as needed  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 1.Explained to him at length again today the importance of quitting smoking 2.Continue prednisone 10 mg daily  as this helps manage his symptoms, however, will try daliresp and assess to wean off prednisone 3.Continue Bactrim along with prednisone for PCP prophylaxis   TOBACCO ABUSE Counseled again to quit smoking at length.   OSA -adjustments made and feels much better -will decrease humidified temp to 65 degrees  Follow up 1 months-will repeat PFT's in 3-4 months  The Patient requires high complexity decision making for assessment and support, frequent evaluation and titration of therapies. Patient/Family are satisfied with Plan of action and management. All questions answered  Lucie Leather, M.D.  [facility]  Medical Director [facility] Medical Director [facility] Cardio-Pulmonary Department

## 2015-02-21 NOTE — Patient Instructions (Signed)
Chronic Obstructive Pulmonary Disease Chronic obstructive pulmonary disease (COPD) is a common lung condition in which airflow from the lungs is limited. COPD is a general term that can be used to describe many different lung problems that limit airflow, including both chronic bronchitis and emphysema. If you have COPD, your lung function will probably never return to normal, but there are measures you can take to improve lung function and make yourself feel better. CAUSES   Smoking (common).  Exposure to secondhand smoke.  Genetic problems.  Chronic inflammatory lung diseases or recurrent infections. SYMPTOMS  Shortness of breath, especially with physical activity.  Deep, persistent (chronic) cough with a large amount of thick mucus.  Wheezing.  Rapid breaths (tachypnea).  Gray or bluish discoloration (cyanosis) of the skin, especially in your fingers, toes, or lips.  Fatigue.  Weight loss.  Frequent infections or episodes when breathing symptoms become much worse (exacerbations).  Chest tightness. DIAGNOSIS Your health care provider will take a medical history and perform a physical examination to diagnose COPD. Additional tests for COPD may include:  Lung (pulmonary) function tests.  Chest X-ray.  CT scan.  Blood tests. TREATMENT  Treatment for COPD may include:  Inhaler and nebulizer medicines. These help manage the symptoms of COPD and make your breathing more comfortable.  Supplemental oxygen. Supplemental oxygen is only helpful if you have a low oxygen level in your blood.  Exercise and physical activity. These are beneficial for nearly all people with COPD.  Lung surgery or transplant.  Nutrition therapy to gain weight, if you are underweight.  Pulmonary rehabilitation. This may involve working with a team of health care providers and specialists, such as respiratory, occupational, and physical therapists. HOME CARE INSTRUCTIONS  Take all medicines  (inhaled or pills) as directed by your health care provider.  Avoid over-the-counter medicines or cough syrups that dry up your airway (such as antihistamines) and slow down the elimination of secretions unless instructed otherwise by your health care provider.  If you are a smoker, the most important thing that you can do is stop smoking. Continuing to smoke will cause further lung damage and breathing trouble. Ask your health care provider for help with quitting smoking. He or she can direct you to community resources or hospitals that provide support.  Avoid exposure to irritants such as smoke, chemicals, and fumes that aggravate your breathing.  Use oxygen therapy and pulmonary rehabilitation if directed by your health care provider. If you require home oxygen therapy, ask your health care provider whether you should purchase a pulse oximeter to measure your oxygen level at home.  Avoid contact with individuals who have a contagious illness.  Avoid extreme temperature and humidity changes.  Eat healthy foods. Eating smaller, more frequent meals and resting before meals may help you maintain your strength.  Stay active, but balance activity with periods of rest. Exercise and physical activity will help you maintain your ability to do things you want to do.  Preventing infection and hospitalization is very important when you have COPD. Make sure to receive all the vaccines your health care provider recommends, especially the pneumococcal and influenza vaccines. Ask your health care provider whether you need a pneumonia vaccine.  Learn and use relaxation techniques to manage stress.  Learn and use controlled breathing techniques as directed by your health care provider. Controlled breathing techniques include:  Pursed lip breathing. Start by breathing in (inhaling) through your nose for 1 second. Then, purse your lips as if you were   going to whistle and breathe out (exhale) through the  pursed lips for 2 seconds.  Diaphragmatic breathing. Start by putting one hand on your abdomen just above your waist. Inhale slowly through your nose. The hand on your abdomen should move out. Then purse your lips and exhale slowly. You should be able to feel the hand on your abdomen moving in as you exhale.  Learn and use controlled coughing to clear mucus from your lungs. Controlled coughing is a series of short, progressive coughs. The steps of controlled coughing are: 1. Lean your head slightly forward. 2. Breathe in deeply using diaphragmatic breathing. 3. Try to hold your breath for 3 seconds. 4. Keep your mouth slightly open while coughing twice. 5. Spit any mucus out into a tissue. 6. Rest and repeat the steps once or twice as needed. SEEK MEDICAL CARE IF:  You are coughing up more mucus than usual.  There is a change in the color or thickness of your mucus.  Your breathing is more labored than usual.  Your breathing is faster than usual. SEEK IMMEDIATE MEDICAL CARE IF:  You have shortness of breath while you are resting.  You have shortness of breath that prevents you from:  Being able to talk.  Performing your usual physical activities.  You have chest pain lasting longer than 5 minutes.  Your skin color is more cyanotic than usual.  You measure low oxygen saturations for longer than 5 minutes with a pulse oximeter. MAKE SURE YOU:  Understand these instructions.  Will watch your condition.  Will get help right away if you are not doing well or get worse.   This information is not intended to replace advice given to you by your health care provider. Make sure you discuss any questions you have with your health care provider.   Document Released: 10/15/2004 Document Revised: 01/26/2014 Document Reviewed: 09/01/2012 Elsevier Interactive Patient Education 2016 [employer]  

## 2015-02-25 DIAGNOSIS — H25813 Combined forms of age-related cataract, bilateral: Secondary | ICD-10-CM | POA: Diagnosis not present

## 2015-02-25 DIAGNOSIS — H524 Presbyopia: Secondary | ICD-10-CM | POA: Diagnosis not present

## 2015-02-25 DIAGNOSIS — H5210 Myopia, unspecified eye: Secondary | ICD-10-CM | POA: Diagnosis not present

## 2015-03-04 ENCOUNTER — Other Ambulatory Visit: Payer: Self-pay

## 2015-03-04 ENCOUNTER — Encounter: Payer: Self-pay | Admitting: Internal Medicine

## 2015-03-04 ENCOUNTER — Encounter: Payer: Commercial Managed Care - HMO | Admitting: Internal Medicine

## 2015-03-04 NOTE — Progress Notes (Signed)
[facility]     Assessment and Plan:  Obstructive sleep apnea. -He had been doing very well with his previous CPAP until it broke.   Cognitive dysfunction with memory loss. -Mild memory loss and difficulty concentrating due to obstructive sleep apnea.  Coronary artery disease.  COPD. -We'll also perform an overnight oximetry on CPAP. The patient will subsequently continue to follow-up with Dr. Belia Heman for his respiratory disease.  Nicotine abuse. -Discussed the importance of smoking cessation, and various strategies to quit. I recommended that he continue the patches. His wife is also a smoker, I asked them to pick a quit date which is of some special significance to them, such as an anniversary, which may be coming up. They should stop smoking, on that day. He should continue with the nicotine patches, and use either lozenges or nicotine gum when he has urges to smoke.   Date: 03/04/2015  MRN# [id number] MORRIS LONGENECKER December 18, 1960   [name] is a 55 y.o. old male seen in follow up for chief complaint of  No chief complaint on file.    HPI:   The patient is a 55 year old male with a history of DIP and RB ILD, diagnosed on lung biopsy. He was also diagnosed with COPD and in 2011 his FEV1 was 53% predicted. He follows with Dr. Belia Heman, in our clinic for the above problems, he is seen here for issues with obstructive sleep apnea.  At last visit it was noted that he had sleep apnea, he was known to already have sleep apnea, however, his CPAP machine had broken. In order to get him covered for a new machine. We have to send him for a repeat titration study.  Review of testing Sleep study on 11/23/2014 showed mild sleep apnea with an apnea apnea-hypopnea index of 15.   Review of tracings from overnight oximetry on 01/11/2015 showed the patient requires 2 L of oxygen at night due to nocturnal hypoxemia while on CPAP.  SIX MIN WALK 11/06/2014 10/23/2011 06/05/2011   Medications Albuterol Inhaler at 12pm - -  Supplimental Oxygen during Test? (L/min) No No No  Laps 6 - -  Partial Lap (in Meters) 0 - -  Baseline BP (sitting) 134/76 - -  Baseline Heartrate 88 - -  Baseline Dyspnea (Borg Scale) 3 - -  Baseline Fatigue (Borg Scale) 3 - -  Baseline SPO2 92 - -  BP (sitting) 144/86 - -  Heartrate 118 - -  Dyspnea (Borg Scale) 3 - -  Fatigue (Borg Scale) 3 - -  SPO2 86 - -  BP (sitting) 152/90 - -  Heartrate 107 - -  SPO2 92 - -  Stopped or Paused before Six Minutes No - -  Distance Completed 288 - -  Tech Comments: Pt was light headed when we started. Got leg cramps in both legs after 3.5 minutes. After walking 300 ft o2 sat was 88% ra, walked an additional 100 ft and o2 was 87%ra and pulse 111- after resting approx 1 min, o2 sat increased to 91%ra. Pt declined o2 start at this time//lmr After walking 500 ft sats were 91%ra and pulse 122//lmr    Pulmonary Functions Testing Results:  No results found for: FEV1, FVC, FEV1FVC, TLC, DLCO   Medication:   Outpatient Encounter Prescriptions as of 03/04/2015  Medication Sig  . albuterol (PROAIR HFA) 108 (90 BASE) MCG/ACT inhaler Inhale 2 puffs into the lungs every 6 (six) hours as needed.  Marland Kitchen albuterol (  PROVENTIL) (2.5 MG/3ML) 0.083% nebulizer solution Take 3 mLs (2.5 mg total) by nebulization every 6 (six) hours as needed. Dx 496  . aspirin 81 MG tablet Take 81 mg by mouth daily.    Marland Kitchen buPROPion (WELLBUTRIN XL) 150 MG 24 hr tablet One tablet daily  . cholecalciferol (VITAMIN D) 1000 UNITS tablet Take 1,000 Units by mouth daily.  . Diphenhydramine-APAP, sleep, (TYLENOL PM EXTRA STRENGTH PO) Take by mouth as needed.  . ferrous gluconate (FERGON) 325 MG tablet Take 325 mg by mouth daily with breakfast.  . fluticasone-salmeterol (ADVAIR HFA) 115-21 MCG/ACT inhaler Inhale 2 puffs into the lungs as needed.  Marland Kitchen guaiFENesin-codeine (ROBITUSSIN AC) 100-10 MG/5ML syrup Take 5 mLs by mouth 3 (three) times daily as  needed for cough.  . hydrOXYzine (VISTARIL) 50 MG capsule Take 1 capsule (50 mg total) by mouth 3 (three) times daily as needed.  Marland Kitchen ipratropium (ATROVENT) 0.03 % nasal spray Place 2 sprays into the nose 4 (four) times daily.  Marland Kitchen lisinopril (PRINIVIL,ZESTRIL) 2.5 MG tablet Take 1 tablet (2.5 mg total) by mouth daily.  . nitroGLYCERIN (NITROSTAT) 0.4 MG SL tablet Place 0.4 mg under the tongue every 5 (five) minutes as needed.    Marland Kitchen omeprazole (PRILOSEC) 20 MG capsule Take 20 mg by mouth daily.  Marland Kitchen PARoxetine (PAXIL-CR) 25 MG 24 hr tablet TAKE 1 TABLET (25 MG TOTAL) BY MOUTH EVERY MORNING.  . pravastatin (PRAVACHOL) 80 MG tablet Take 1 tablet (80 mg total) by mouth daily.  . predniSONE (DELTASONE) 10 MG tablet Take 10 mg by mouth daily.  . roflumilast (DALIRESP) 500 MCG TABS tablet Take 1 tablet (500 mcg total) by mouth daily.  Marland Kitchen sulfamethoxazole-trimethoprim (BACTRIM DS,SEPTRA DS) 800-160 MG tablet Take 1 tablet Every Monday, Wednesday and Friday  . tiotropium (SPIRIVA) 18 MCG inhalation capsule Place 1 capsule (18 mcg total) into inhaler and inhale daily.  . valsartan (DIOVAN) 80 MG tablet TAKE 1 TABLET (80 MG TOTAL) BY MOUTH DAILY.   No facility-administered encounter medications on file as of 03/04/2015.     Allergies:  Advair diskus ; Clarithromycin; and Penicillins  Review of Systems: Gen:  Denies  fever, sweats. HEENT: Denies blurred vision. Cvc:  No dizziness, chest pain or heaviness Resp:   Denies cough or sputum porduction. Gi: Denies swallowing difficulty, stomach pain. constipation, bowel incontinence Gu:  Denies bladder incontinence, burning urine Ext:   No Joint pain, stiffness. Skin: No skin rash, easy bruising. Endoc:  No polyuria, polydipsia. Psych: No depression, insomnia. Other:  All other systems were reviewed and found to be negative other than what is mentioned in the HPI.   Physical Examination:   VS: There were no vitals taken for this visit.  General Appearance:  No distress  Neuro:without focal findings,  speech normal,  HEENT: PERRLA, EOM intact. Pulmonary: normal breath sounds, No wheezing.   CardiovascularNormal S1,S2.  No m/r/g.   Abdomen: Benign, Soft, non-tender. Renal:  No costovertebral tenderness  GU:  Not performed at this time. Endoc: No evident thyromegaly, no signs of acromegaly. Skin:   warm, no rash. Extremities: normal, no cyanosis, clubbing.   LABORATORY PANEL:   CBC No results for input(s): WBC, HGB, HCT, PLT in the last 168 hours. ------------------------------------------------------------------------------------------------------------------  Chemistries  No results for input(s): NA, K, CL, CO2, GLUCOSE, BUN, CREATININE, CALCIUM, MG, AST, ALT, ALKPHOS, BILITOT in the last 168 hours.  Invalid input(s): GFRCGP ------------------------------------------------------------------------------------------------------------------  Cardiac Enzymes No results for input(s): TROPONINI in the last 168 hours. ------------------------------------------------------------  RADIOLOGY:  No results found for this or any previous visit. Results for orders placed in visit on 07/24/14  DG Chest 2 View   Narrative CLINICAL DATA:  Interstitial lung disease.  EXAM: CHEST  2 VIEW  COMPARISON:  10/23/2011 .  FINDINGS: Mediastinum and hilar structures are normal. Heart size is normal. Pulmonary vascularity is normal. Diffuse interstitial prominence noted consistent with chronic interstitial lung disease. No pleural effusion or pneumothorax.  IMPRESSION: 1. Diffuse interstitial prominence consistent chronic interstitial lung disease. 2. Cardiomegaly, no pulmonary venous congestion.   Electronically Signed   By: Maisie Fus  Register   On: 07/24/2014 12:02    ------------------------------------------------------------------------------------------------------------------  Thank  you for allowing [facility] to assist in the care of your patient. Our recommendations are noted above.  Please contact us if we can be of further service.   Wells Guiles, MD.  [facility] Office Number: [phone]  Santiago Glad, M.D.  Stephanie Acre, M.D.  Billy Fischer, M.D  This encounter was created in error - please disregard.

## 2015-03-06 ENCOUNTER — Ambulatory Visit (INDEPENDENT_AMBULATORY_CARE_PROVIDER_SITE_OTHER): Payer: Commercial Managed Care - HMO | Admitting: Internal Medicine

## 2015-03-06 ENCOUNTER — Encounter: Payer: Self-pay | Admitting: Internal Medicine

## 2015-03-06 VITALS — BP 126/74 | HR 86 | Ht 71.0 in | Wt 152.8 lb

## 2015-03-06 DIAGNOSIS — G4733 Obstructive sleep apnea (adult) (pediatric): Secondary | ICD-10-CM

## 2015-03-06 DIAGNOSIS — R351 Nocturia: Secondary | ICD-10-CM

## 2015-03-06 MED ORDER — TAMSULOSIN HCL 0.4 MG PO CAPS: 0.4000 mg | ORAL_CAPSULE | Freq: Every day | ORAL | Status: DC

## 2015-03-06 NOTE — Progress Notes (Signed)
[facility]     Assessment and Plan:  Obstructive sleep apnea. -He had been doing very well with his previous CPAP, continue current use, continue oxygen at 2 L at night.  Nocturia. -Patient reports frequent trips to the bathroom about 4-5 times per night due to prostate enlargement. He attributes this to Spiriva and finds that this is improved when he does not take the Spiriva. Therefore, he often skips his dose of Spiriva. -We will start Flomax, asked to take it at night, and wait for a few days to notice any effects.  Cognitive dysfunction with memory loss. -Mild memory loss and difficulty concentrating due to obstructive sleep apnea.  Coronary artery disease.  COPD with acute hypoxic respiratory failure. -Patient is hypoxic today, he is not in acute respiratory failure, this continues to be a chronic issue. -He has portable oxygen tanks at home that he does not use. His oxygen saturation was 79% on room air when entering the office today. We started him on 2 L of oxygen and he was strongly advised to continue use of ambulatory oxygen whenever leaving the home and around the house. -I also advised patient to buy an O2 sat monitor from [employer] to help guide his oxygen use. He should use oxygen whenever his oxygen saturation is below 90%.  Nicotine abuse. -Beginning discussed the importance of smoking cessation today, he thinks he is not quite ready to quit yet.   Date: 03/06/2015  MRN# [id number] Dennis Barajas [birth year]   Dennis Barajas is a 55 y.o. old male seen in follow up for chief complaint of  No chief complaint on file.    HPI:   Patient presents for follow-up on sleep apnea, he is been started on CPAP on an auto setting with excellent compliance, and excellent reduction in residual AHI as reviewed below.  The patient is a 55 year old male with a history of DIP and RB ILD, diagnosed on lung biopsy. He was also diagnosed with COPD and in 2011  his FEV1 was 53% predicted. He follows with Dr. Belia Heman, in our clinic for the above problems, he is seen here for issues with obstructive sleep apnea.  At last visit it was noted that he had sleep apnea, he was known to already have sleep apnea, however, his CPAP machine had broken. In order to get him covered for a new machine. We have to send him for a repeat titration study.  Review of testing Sleep study on 11/23/2014 showed mild sleep apnea with an apnea apnea-hypopnea index of 15.   Review of tracings from overnight oximetry on 01/11/2015 showed the patient requires 2 L of oxygen at night due to nocturnal hypoxemia while on CPAP.  Review of download data and tracings: Patient is on auto set between 10 and 16. Residual AHI is 0.3, median pressure is 10 with the 95th percentile of 12, max was 13.5.  SIX MIN WALK 11/06/2014 10/23/2011 06/05/2011  Medications Albuterol Inhaler at 12pm - -  Supplimental Oxygen during Test? (L/min) No No No  Laps 6 - -  Partial Lap (in Meters) 0 - -  Baseline BP (sitting) 134/76 - -  Baseline Heartrate 88 - -  Baseline Dyspnea (Borg Scale) 3 - -  Baseline Fatigue (Borg Scale) 3 - -  Baseline SPO2 92 - -  BP (sitting) 144/86 - -  Heartrate 118 - -  Dyspnea (Borg Scale) 3 - -  Fatigue (Borg Scale) 3 - -  SPO2 86 - -  BP (sitting) 152/90 - -  Heartrate 107 - -  SPO2 92 - -  Stopped or Paused before Six Minutes No - -  Distance Completed 288 - -  Tech Comments: Pt was light headed when we started. Got leg cramps in both legs after 3.5 minutes. After walking 300 ft o2 sat was 88% ra, walked an additional 100 ft and o2 was 87%ra and pulse 111- after resting approx 1 min, o2 sat increased to 91%ra. Pt declined o2 start at this time//lmr After walking 500 ft sats were 91%ra and pulse 122//lmr    Pulmonary Functions Testing Results:  No results found for: FEV1, FVC, FEV1FVC, TLC, DLCO   Medication:   Outpatient Encounter Prescriptions as of 03/06/2015    Medication Sig  . albuterol (PROAIR HFA) 108 (90 BASE) MCG/ACT inhaler Inhale 2 puffs into the lungs every 6 (six) hours as needed.  Marland Kitchen albuterol (PROVENTIL) (2.5 MG/3ML) 0.083% nebulizer solution Take 3 mLs (2.5 mg total) by nebulization every 6 (six) hours as needed. Dx 496  . aspirin 81 MG tablet Take 81 mg by mouth daily.    Marland Kitchen buPROPion (WELLBUTRIN XL) 150 MG 24 hr tablet One tablet daily  . cholecalciferol (VITAMIN D) 1000 UNITS tablet Take 1,000 Units by mouth daily.  . Diphenhydramine-APAP, sleep, (TYLENOL PM EXTRA STRENGTH PO) Take by mouth as needed.  . ferrous gluconate (FERGON) 325 MG tablet Take 325 mg by mouth daily with breakfast.  . fluticasone-salmeterol (ADVAIR HFA) 115-21 MCG/ACT inhaler Inhale 2 puffs into the lungs as needed.  Marland Kitchen guaiFENesin-codeine (ROBITUSSIN AC) 100-10 MG/5ML syrup Take 5 mLs by mouth 3 (three) times daily as needed for cough.  . hydrOXYzine (VISTARIL) 50 MG capsule Take 1 capsule (50 mg total) by mouth 3 (three) times daily as needed.  Marland Kitchen ipratropium (ATROVENT) 0.03 % nasal spray Place 2 sprays into the nose 4 (four) times daily.  Marland Kitchen lisinopril (PRINIVIL,ZESTRIL) 2.5 MG tablet Take 1 tablet (2.5 mg total) by mouth daily.  . nitroGLYCERIN (NITROSTAT) 0.4 MG SL tablet Place 0.4 mg under the tongue every 5 (five) minutes as needed.    Marland Kitchen omeprazole (PRILOSEC) 20 MG capsule Take 20 mg by mouth daily.  Marland Kitchen PARoxetine (PAXIL-CR) 25 MG 24 hr tablet TAKE 1 TABLET (25 MG TOTAL) BY MOUTH EVERY MORNING.  . pravastatin (PRAVACHOL) 80 MG tablet Take 1 tablet (80 mg total) by mouth daily.  . predniSONE (DELTASONE) 10 MG tablet Take 10 mg by mouth daily.  . roflumilast (DALIRESP) 500 MCG TABS tablet Take 1 tablet (500 mcg total) by mouth daily.  Marland Kitchen sulfamethoxazole-trimethoprim (BACTRIM DS,SEPTRA DS) 800-160 MG tablet Take 1 tablet Every Monday, Wednesday and Friday  . tiotropium (SPIRIVA) 18 MCG inhalation capsule Place 1 capsule (18 mcg total) into inhaler and inhale daily.   . valsartan (DIOVAN) 80 MG tablet TAKE 1 TABLET (80 MG TOTAL) BY MOUTH DAILY.   No facility-administered encounter medications on file as of 03/06/2015.     Allergies:  Advair diskus ; Clarithromycin; and Penicillins  Review of Systems: Gen:  Denies  fever, sweats. HEENT: Denies blurred vision. Cvc:  No dizziness, chest pain or heaviness Resp:   Denies cough or sputum porduction. Gi: Denies swallowing difficulty, stomach pain.  Gu:  Denies bladder incontinence, burning urine Ext:   No Joint pain, stiffness. Skin: No skin rash, easy bruising. Endoc:  No polyuria, polydipsia. Psych: No depression, insomnia. Other:  All other systems were reviewed and found to be negative other than what is mentioned  in the HPI.   Physical Examination:   VS: There were no vitals taken for this visit.  General Appearance: No distress  Neuro:without focal findings,  speech normal,  HEENT: PERRLA, EOM intact. Pulmonary: normal breath sounds, No wheezing.   CardiovascularNormal S1,S2.  No m/r/g.   Abdomen: Benign, Soft, non-tender. Renal:  No costovertebral tenderness  GU:  Not performed at this time. Endoc: No evident thyromegaly, no signs of acromegaly. Skin:   warm, no rash. Extremities: normal, no cyanosis, clubbing.   LABORATORY PANEL:   CBC No results for input(s): WBC, HGB, HCT, PLT in the last 168 hours. ------------------------------------------------------------------------------------------------------------------  Chemistries  No results for input(s): NA, K, CL, CO2, GLUCOSE, BUN, CREATININE, CALCIUM, MG, AST, ALT, ALKPHOS, BILITOT in the last 168 hours.  Invalid input(s): GFRCGP ------------------------------------------------------------------------------------------------------------------  Cardiac Enzymes No results for input(s): TROPONINI in the last 168 hours. ------------------------------------------------------------  RADIOLOGY:   No results found for this or any  previous visit. Results for orders placed in visit on 07/24/14  DG Chest 2 View   Narrative CLINICAL DATA:  Interstitial lung disease.  EXAM: CHEST  2 VIEW  COMPARISON:  10/23/2011 .  FINDINGS: Mediastinum and hilar structures are normal. Heart size is normal. Pulmonary vascularity is normal. Diffuse interstitial prominence noted consistent with chronic interstitial lung disease. No pleural effusion or pneumothorax.  IMPRESSION: 1. Diffuse interstitial prominence consistent chronic interstitial lung disease. 2. Cardiomegaly, no pulmonary venous congestion.   Electronically Signed   By: Maisie Fus  Register   On: 07/24/2014 12:02    ------------------------------------------------------------------------------------------------------------------  Thank  you for allowing [facility] to assist in the care of your patient. Our recommendations are noted above.  Please contact us if we can be of further service.   Wells Guiles, MD.  [facility]   Santiago Glad, M.D.  Stephanie Acre, M.D.  Billy Fischer, M.D

## 2015-03-06 NOTE — Patient Instructions (Signed)
Flomax 0.4 mg nightly.   Use your oxygen at night and during the day.   I suggest you purchase an oxygen, saturation monitor from [employer], your oxygen should be above 90%, if not he should be using oxygen.  He must quit smoking.  Continue using her CPAP every night, with 2 L of oxygen.

## 2015-03-06 NOTE — Addendum Note (Signed)
Addended by: Maxwell Marion A on: 03/06/2015 10:56 AM   Modules accepted: Orders

## 2015-03-13 DIAGNOSIS — J984 Other disorders of lung: Secondary | ICD-10-CM | POA: Diagnosis not present

## 2015-03-13 DIAGNOSIS — G4733 Obstructive sleep apnea (adult) (pediatric): Secondary | ICD-10-CM | POA: Diagnosis not present

## 2015-03-13 DIAGNOSIS — R0602 Shortness of breath: Secondary | ICD-10-CM | POA: Diagnosis not present

## 2015-03-13 DIAGNOSIS — J449 Chronic obstructive pulmonary disease, unspecified: Secondary | ICD-10-CM | POA: Diagnosis not present

## 2015-03-19 DIAGNOSIS — J449 Chronic obstructive pulmonary disease, unspecified: Secondary | ICD-10-CM | POA: Diagnosis not present

## 2015-03-19 DIAGNOSIS — R0602 Shortness of breath: Secondary | ICD-10-CM | POA: Diagnosis not present

## 2015-04-03 ENCOUNTER — Ambulatory Visit (INDEPENDENT_AMBULATORY_CARE_PROVIDER_SITE_OTHER): Payer: Commercial Managed Care - HMO | Admitting: Cardiovascular Disease

## 2015-04-03 ENCOUNTER — Encounter: Payer: Self-pay | Admitting: Cardiovascular Disease

## 2015-04-03 VITALS — BP 122/84 | HR 92 | Ht 71.0 in | Wt 152.8 lb

## 2015-04-03 DIAGNOSIS — I2510 Atherosclerotic heart disease of native coronary artery without angina pectoris: Secondary | ICD-10-CM | POA: Diagnosis not present

## 2015-04-03 DIAGNOSIS — J432 Centrilobular emphysema: Secondary | ICD-10-CM | POA: Diagnosis not present

## 2015-04-03 DIAGNOSIS — I700 Atherosclerosis of aorta: Secondary | ICD-10-CM | POA: Diagnosis not present

## 2015-04-03 DIAGNOSIS — F17200 Nicotine dependence, unspecified, uncomplicated: Secondary | ICD-10-CM | POA: Diagnosis not present

## 2015-04-03 DIAGNOSIS — E785 Hyperlipidemia, unspecified: Secondary | ICD-10-CM

## 2015-04-03 NOTE — Assessment & Plan Note (Signed)
High risk of  worsening coronary disease given his continued smoking history  discussed smoking cessation for least 4 minutes  On today's visit  No further testing ordered, recommended he call the office for any worsening shortness of breath or chest pain concerning for angina

## 2015-04-03 NOTE — Assessment & Plan Note (Signed)
We have encouraged him to continue to work on weaning his cigarettes and smoking cessation. He will continue to work on this and does not want any assistance with chantix.  

## 2015-04-03 NOTE — Assessment & Plan Note (Signed)
Baseline chronic shortness of breath.  Recommended smoking cessation. Managed by pulmonary  And Dr. Thedore MinsSingh

## 2015-04-03 NOTE — Progress Notes (Signed)
Patient ID: Dennis Barajas, male    DOB: 05-06-1960, 55 y.o.   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,  history of coronary artery disease, long smoking history, underlying lung disease/interstitial lung disease, COPD and obstructive sleep apnea who had a non-STEMI in 2009 with cardiac catheterization at that time showing severe distal RCA disease and distal circumflex disease with bare-metal stent x2 placed, ejection fraction at that time was 55% who presents for routine followup  Of his coronary artery disease He started smoking when he was 17, total 30+ years and continues to smoke 1-1/2 packs per day  history of anxiety   in follow-up today, he reports that he continues to smoke approximately 10 cigarettes per day  Has chronic shortness of breath , worse on exertion  Uses oxygen with his CPAP  At nighttime  Was told he could use oxygen for heavy exertion during the daytime but does not do this  Wife reports he is relatively sedentary during the day  Denies any chest pain or shoulder pain concerning for angina  No lower extremity edema, no PND or orthopnea   Reports blood pressures typically well controlled,  No tachycardia or palpitations.  Lab work reviewed with him showing total cholesterol 192, LDL 104. He is scheduled to have this rechecked when he sees primary care   EKG on today's visit shows normal sinus rhythm with rate 92 bpm, nonspecific ST abnormality in lead 3, aVF    Allergies  Allergen Reactions  . Advair Diskus  [Fluticasone-Salmeterol] Swelling  . Clarithromycin     REACTION: [place]  . Penicillins     REACTION: [place]    Current Outpatient Prescriptions on File Prior to Visit  Medication Sig Dispense Refill  . albuterol (PROAIR HFA) 108 (90 BASE) MCG/ACT inhaler Inhale 2 puffs into the lungs every 6 (six) hours as needed. 3 Inhaler 3  .  albuterol (PROVENTIL) (2.5 MG/3ML) 0.083% nebulizer solution Take 3 mLs (2.5 mg total) by nebulization every 6 (six) hours as needed. Dx 496 360 mL 1  . aspirin 81 MG tablet Take 81 mg by mouth daily.      . cholecalciferol (VITAMIN D) 1000 UNITS tablet Take 1,000 Units by mouth daily.    . Diphenhydramine-APAP, sleep, (TYLENOL PM EXTRA STRENGTH PO) Take by mouth as needed.    . ferrous gluconate (FERGON) 325 MG tablet Take 325 mg by mouth daily with breakfast.    . fluticasone-salmeterol (ADVAIR HFA) 115-21 MCG/ACT inhaler Inhale 2 puffs into the lungs as needed. 1 Inhaler 5  . hydrOXYzine (VISTARIL) 50 MG capsule Take 1 capsule (50 mg total) by mouth 3 (three) times daily as needed. 90 capsule 11  . ipratropium (ATROVENT) 0.03 % nasal spray Place 2 sprays into the nose 4 (four) times daily. 30 mL 1  . lisinopril (PRINIVIL,ZESTRIL) 2.5 MG tablet Take 1 tablet (2.5 mg total) by mouth daily. 90 tablet 3  . nitroGLYCERIN (NITROSTAT) 0.4 MG SL tablet Place 0.4 mg under the tongue every 5 (five) minutes as needed.      Marland Kitchen omeprazole (PRILOSEC) 20 MG capsule Take 20 mg by mouth daily.    Marland Kitchen PARoxetine (PAXIL-CR) 25 MG 24 hr tablet TAKE 1 TABLET (25 MG TOTAL) BY MOUTH EVERY MORNING. 30 tablet 0  . pravastatin (PRAVACHOL) 80 MG tablet Take 1 tablet (80 mg total) by mouth daily. 90 tablet 3  .  predniSONE (DELTASONE) 10 MG tablet Take 10 mg by mouth daily.  2  . roflumilast (DALIRESP) 500 MCG TABS tablet Take 1 tablet (500 mcg total) by mouth daily. 30 tablet 10  . sulfamethoxazole-trimethoprim (BACTRIM DS,SEPTRA DS) 800-160 MG tablet Take 1 tablet Every Monday, Wednesday and Friday 30 tablet 2  . tiotropium (SPIRIVA) 18 MCG inhalation capsule Place 1 capsule (18 mcg total) into inhaler and inhale daily. 30 capsule 5  . valsartan (DIOVAN) 80 MG tablet TAKE 1 TABLET (80 MG TOTAL) BY MOUTH DAILY. 90 tablet 3   No current facility-administered medications on file prior to visit.    Past Medical History   Diagnosis Date  . COPD (chronic obstructive pulmonary disease) (HCC)   . OSA (obstructive sleep apnea)   . Smoker   . Acute sinusitis   . Anxiety   . Hyperlipidemia   . Memory loss   . CAD (coronary artery disease) 2009    [facility]    Past Surgical History  Procedure Laterality Date  . Cholecystectomy    . Appendectomy    . Knee surgery      bilateral knee surgery  . Cardiac catheterization  2009    stent placed @ [facility]   . Lung biopsy      x 2     Social History  reports that he has been smoking Cigarettes.  He has a 24.75 pack-year smoking history. He has never used smokeless tobacco. He reports that he does not drink alcohol or use illicit drugs.  Family History family history includes Diabetes in his mother; Testicular cancer in his brother.   Review of Systems  Constitutional: Negative.   Respiratory: Positive for shortness of breath.   Cardiovascular: Negative.   Gastrointestinal: Negative.   Musculoskeletal: Negative.   Neurological: Negative.   Hematological: Negative.   Psychiatric/Behavioral: The patient is nervous/anxious.   All other systems reviewed and are negative.   BP 122/84 mmHg  Pulse 92  Ht 5\' 11"  (1.803 m)  Wt 152 lb 12 oz (69.287 kg)  BMI 21.31 kg/m2   Physical Exam  Constitutional: He is oriented to person, place, and time. He appears well-developed and well-nourished.  HENT:  Head: Normocephalic.  Nose: Nose normal.  Mouth/Throat: Oropharynx is clear and moist.  Eyes: Conjunctivae are normal. Pupils are equal, round, and reactive to light.  Neck: Normal range of motion. Neck supple. No JVD present.  Cardiovascular: Normal rate, regular rhythm, S1 normal, S2 normal, normal heart sounds and intact distal pulses.  Exam reveals no gallop and no friction rub.   No murmur heard. Pulmonary/Chest: He is in respiratory distress. He has decreased breath sounds. He has wheezes. He has rales. He exhibits no tenderness.  Diffuse  wheezing, rhonchi  Abdominal: Soft. Bowel sounds are normal. He exhibits no distension. There is no tenderness.  Musculoskeletal: Normal range of motion. He exhibits no edema or tenderness.  Lymphadenopathy:    He has no cervical adenopathy.  Neurological: He is alert and oriented to person, place, and time. Coordination normal.  Skin: Skin is warm and dry. No rash noted. No erythema.  Psychiatric: He has a normal mood and affect. His behavior is normal. Judgment and thought content normal.      Assessment and Plan   Nursing note and vitals reviewed.

## 2015-04-03 NOTE — Assessment & Plan Note (Signed)
Cholesterol above goal. He would like to recheck his numbers when he sees primary care  Based on these numbers, recommended he start zetia  In addition to his statin to reach goal LDL less than 70

## 2015-04-03 NOTE — Patient Instructions (Addendum)
You are doing well. No medication changes were made.  Please check your cholesterol pill, ?pravastatin 80 mg daily Other option lipitor 80 mg  Another option zetia one a day  Goal total cholesterol is <150  Please call us if you have new issues that need to be addressed before your next appt.  Your physician wants you to follow-up in: 6 months.  You will receive a reminder letter in the mail two months in advance. If you don't receive a letter, please call our office to schedule the follow-up appointment.     Steps to Quit Smoking  Smoking tobacco can be harmful to your health and can affect almost every organ in your body. Smoking puts you, and those around you, at risk for developing many serious chronic diseases. Quitting smoking is difficult, but it is one of the best things that you can do for your health. It is never too late to quit. WHAT ARE THE BENEFITS OF QUITTING SMOKING? When you quit smoking, you lower your risk of developing serious diseases and conditions, such as:  Lung cancer or lung disease, such as COPD.  Heart disease.  Stroke.  Heart attack.  Infertility.  Osteoporosis and bone fractures. Additionally, symptoms such as coughing, wheezing, and shortness of breath may get better when you quit. You may also find that you get sick less often because your body is stronger at fighting off colds and infections. If you are pregnant, quitting smoking can help to reduce your chances of having a baby of low birth weight. HOW DO I GET READY TO QUIT? When you decide to quit smoking, create a plan to make sure that you are successful. Before you quit:  Pick a date to quit. Set a date within the next two weeks to give you time to prepare.  Write down the reasons why you are quitting. Keep this list in places where you will see it often, such as on your bathroom mirror or in your car or wallet.  Identify the people, places, things, and activities that make you want to  smoke (triggers) and avoid them. Make sure to take these actions:  Throw away all cigarettes at home, at work, and in your car.  Throw away smoking accessories, such as Set designer.  Clean your car and make sure to empty the ashtray.  Clean your home, including curtains and carpets.  Tell your family, friends, and coworkers that you are quitting. Support from your loved ones can make quitting easier.  Talk with your health care provider about your options for quitting smoking.  Find out what treatment options are covered by your health insurance. WHAT STRATEGIES CAN I USE TO QUIT SMOKING?  Talk with your healthcare provider about different strategies to quit smoking. Some strategies include:  Quitting smoking altogether instead of gradually lessening how much you smoke over a period of time. Research shows that quitting "cold [place]" is more successful than gradually quitting.  Attending in-person counseling to help you build problem-solving skills. You are more likely to have success in quitting if you attend several counseling sessions. Even short sessions of 10 minutes can be effective.  Finding resources and support systems that can help you to quit smoking and remain smoke-free after you quit. These resources are most helpful when you use them often. They can include:  Online chats with a Veterinary surgeon.  Telephone quitlines.  Printed Materials engineer.  Support groups or group counseling.  Text messaging programs.  Mobile phone applications.  Taking medicines to help you quit smoking. (If you are pregnant or breastfeeding, talk with your health care provider first.) Some medicines contain nicotine and some do not. Both types of medicines help with cravings, but the medicines that include nicotine help to relieve withdrawal symptoms. Your health care provider may recommend:  Nicotine patches, gum, or lozenges.  Nicotine inhalers or sprays.  Non-nicotine medicine  that is taken by mouth. Talk with your health care provider about combining strategies, such as taking medicines while you are also receiving in-person counseling. Using these two strategies together makes you more likely to succeed in quitting than if you used either strategy on its own. If you are pregnant or breastfeeding, talk with your health care provider about finding counseling or other support strategies to quit smoking. Do not take medicine to help you quit smoking unless told to do so by your health care provider. WHAT THINGS CAN I DO TO MAKE IT EASIER TO QUIT? Quitting smoking might feel overwhelming at first, but there is a lot that you can do to make it easier. Take these important actions:  Reach out to your family and friends and ask that they support and encourage you during this time. Call telephone quitlines, reach out to support groups, or work with a counselor for support.  Ask people who smoke to avoid smoking around you.  Avoid places that trigger you to smoke, such as bars, parties, or smoke-break areas at work.  Spend time around people who do not smoke.  Lessen stress in your life, because stress can be a smoking trigger for some people. To lessen stress, try:  Exercising regularly.  Deep-breathing exercises.  Yoga.  Meditating.  Performing a body scan. This involves closing your eyes, scanning your body from head to toe, and noticing which parts of your body are particularly tense. Purposefully relax the muscles in those areas.  Download or purchase mobile phone or tablet apps (applications) that can help you stick to your quit plan by providing reminders, tips, and encouragement. There are many free apps, such as QuitGuide from the [employer] developer(Centers for Disease Control and Prevention). You can find other support for quitting smoking (smoking cessation) through smokefree.gov and other websites. HOW WILL I FEEL WHEN I QUIT SMOKING? Within the first 24 hours of  quitting smoking, you may start to feel some withdrawal symptoms. These symptoms are usually most noticeable 2-3 days after quitting, but they usually do not last beyond 2-3 weeks. Changes or symptoms that you might experience include:  Mood swings.  Restlessness, anxiety, or irritation.  Difficulty concentrating.  Dizziness.  Strong cravings for sugary foods in addition to nicotine.  Mild weight gain.  Constipation.  Nausea.  Coughing or a sore throat.  Changes in how your medicines work in your body.  A depressed mood.  Difficulty sleeping (insomnia). After the first 2-3 weeks of quitting, you may start to notice more positive results, such as:  Improved sense of smell and taste.  Decreased coughing and sore throat.  Slower heart rate.  Lower blood pressure.  Clearer skin.  The ability to breathe more easily.  Fewer sick days. Quitting smoking is very challenging for most people. Do not get discouraged if you are not successful the first time. Some people need to make many attempts to quit before they achieve long-term success. Do your best to stick to your quit plan, and talk with your health care provider if you have any questions or concerns.   This  information is not intended to replace advice given to you by your health care provider. Make sure you discuss any questions you have with your health care provider.   Document Released: 12/30/2000 Document Revised: 05/22/2014 Document Reviewed: 05/22/2014 Elsevier Interactive Patient Education 2016 [employer]. Smoking Hazards Smoking cigarettes is extremely bad for your health. Tobacco smoke has over 200 known poisons in it. It contains the poisonous gases nitrogen oxide and carbon monoxide. There are over 60 chemicals in tobacco smoke that cause cancer. Some of the chemicals found in cigarette smoke include:   Cyanide.   Benzene.   Formaldehyde.   Methanol (wood alcohol).   Acetylene (fuel used in  welding torches).   Ammonia.  Even smoking lightly shortens your life expectancy by several years. You can greatly reduce the risk of medical problems for you and your family by stopping now. Smoking is the most preventable cause of death and disease in our society. Within days of quitting smoking, your circulation improves, you decrease the risk of having a heart attack, and your lung capacity improves. There may be some increased phlegm in the first few days after quitting, and it may take months for your lungs to clear up completely. Quitting for 10 years reduces your risk of developing lung cancer to almost that of a nonsmoker.  WHAT ARE THE RISKS OF SMOKING? Cigarette smokers have an increased risk of many serious medical problems, including:  Lung cancer.   Lung disease (such as pneumonia, bronchitis, and emphysema).   Heart attack and chest pain due to the heart not getting enough oxygen (angina).   Heart disease and peripheral blood vessel disease.   Hypertension.   Stroke.   Oral cancer (cancer of the lip, mouth, or voice box).   Bladder cancer.   Pancreatic cancer.   Cervical cancer.   Pregnancy complications, including premature birth.   Stillbirths and smaller newborn babies, birth defects, and genetic damage to sperm.   Early menopause.   Lower estrogen level for women.   Infertility.   Facial wrinkles.   Blindness.   Increased risk of broken bones (fractures).   Senile dementia.   Stomach ulcers and internal bleeding.   Delayed wound healing and increased risk of complications during surgery. Because of secondhand smoke exposure, children of smokers have an increased risk of the following:   Sudden infant death syndrome (SIDS).   Respiratory infections.   Lung cancer.   Heart disease.   Ear infections.  WHY IS SMOKING ADDICTIVE? Nicotine is the chemical agent in tobacco that is capable of causing addiction or  dependence. When you smoke and inhale, nicotine is absorbed rapidly into the bloodstream through your lungs. Both inhaled and noninhaled nicotine may be addictive.  WHAT ARE THE BENEFITS OF QUITTING?  There are many health benefits to quitting smoking. Some are:   The likelihood of developing cancer and heart disease decreases. Health improvements are seen almost immediately.   Blood pressure, pulse rate, and breathing patterns start returning to normal soon after quitting.   People who quit may see an improvement in their overall quality of life.  HOW DO YOU QUIT SMOKING? Smoking is an addiction with both physical and psychological effects, and longtime habits can be hard to change. Your health care provider can recommend:  Programs and community resources, which may include group support, education, or therapy.  Replacement products, such as patches, gum, and nasal sprays. Use these products only as directed. Do not replace cigarette smoking with electronic  cigarettes (commonly called e-cigarettes). The safety of e-cigarettes is unknown, and some may contain harmful chemicals. FOR MORE INFORMATION  American Lung Association: www.lung.org  American Cancer Society: www.cancer.org   This information is not intended to replace advice given to you by your health care provider. Make sure you discuss any questions you have with your health care provider.   Document Released: 02/13/2004 Document Revised: 10/26/2012 Document Reviewed: 06/27/2012 Elsevier Interactive Patient Education 2016 [employer]. Secondhand Smoke WHAT IS SECONDHAND SMOKE? Secondhand smoke is smoke that comes from burning tobacco. It could be the smoke from a cigarette, a pipe, or a cigar. Even if you are not the one smoking, secondhand smoke exposes you to the dangers of smoking. This is called involuntary, or passive, smoking. There are two types of secondhand smoke:  Sidestream smoke is the smoke that comes off the  lighted end of a cigarette, pipe, or cigar.  This type of smoke has the highest amount of cancer-causing agents (carcinogens).  The particles in sidestream smoke are smaller. They get into your lungs more easily.  Mainstream smoke is the smoke that is exhaled by a person who is smoking.  This type of smoke is also dangerous to your health. HOW CAN SECONDHAND SMOKE AFFECT MY HEALTH? Studies show that there is no safe level of secondhand smoke. This smoke contains thousands of chemicals. At least 69 of them are known to cause cancer. Secondhand smoke can also cause many other health problems. It has been linked to:  Lung cancer.  Cancer of the voice box (larynx) or throat.  Cancer of the sinuses.  Brain cancer.  Bladder cancer.  Stomach cancer.  Breast cancer.  White blood cell cancers (lymphoma and leukemia).  Brain and liver tumors in children.  Heart disease and stroke in adults.  Pregnancy loss (miscarriage).  Diseases in children, such as:  Asthma.  Lung infections.  Ear infections.  Sudden infant death syndrome (SIDS).  Slow growth. WHERE CAN I BE AT RISK FOR EXPOSURE TO SECONDHAND SMOKE?   For adults, the workplace is the main source of exposure to secondhand smoke.  Your workplace should have a policy separating smoking areas from nonsmoking areas.  Smoking areas should have a system for ventilating and cleaning the air.  For children, the home may be the most dangerous place for exposure to secondhand smoke.  Children who live in apartment buildings may be at risk from smoke drifting from hallways or other people's homes.  For everyone, many public places are possible sources of exposure to secondhand smoke.  These places include restaurants, shopping centers, and parks. HOW CAN I REDUCE MY RISK FOR EXPOSURE TO SECONDHAND SMOKE? The most important thing you can do is not smoke. Discourage family members from smoking. Other ways to reduce exposure  for you and your family include the following:  Keep your home smoke free.  Make sure your child care providers do not smoke.  Warn your child about the dangers of smoking and secondhand smoke.  Do not allow smoking in your car. When someone smokes in a car, all the damaging chemicals from the smoke are confined in a small area.  Avoid public places where smoking is allowed.   This information is not intended to replace advice given to you by your health care provider. Make sure you discuss any questions you have with your health care provider.   Document Released: 02/13/2004 Document Revised: 01/26/2014 Document Reviewed: 04/21/2013 Elsevier Interactive Patient Education [employer]

## 2015-04-04 ENCOUNTER — Other Ambulatory Visit: Payer: Self-pay | Admitting: *Deleted

## 2015-04-04 ENCOUNTER — Ambulatory Visit: Payer: Commercial Managed Care - HMO | Admitting: Internal Medicine

## 2015-04-04 MED ORDER — PREDNISONE 10 MG PO TABS: 10.0000 mg | ORAL_TABLET | Freq: Every day | ORAL | Status: DC

## 2015-04-04 MED ORDER — SULFAMETHOXAZOLE-TRIMETHOPRIM 800-160 MG PO TABS: ORAL_TABLET | ORAL | Status: DC

## 2015-04-05 ENCOUNTER — Ambulatory Visit: Payer: Commercial Managed Care - HMO | Admitting: Internal Medicine

## 2015-04-10 DIAGNOSIS — R0602 Shortness of breath: Secondary | ICD-10-CM | POA: Diagnosis not present

## 2015-04-10 DIAGNOSIS — J449 Chronic obstructive pulmonary disease, unspecified: Secondary | ICD-10-CM | POA: Diagnosis not present

## 2015-04-10 DIAGNOSIS — G4733 Obstructive sleep apnea (adult) (pediatric): Secondary | ICD-10-CM | POA: Diagnosis not present

## 2015-04-10 DIAGNOSIS — J984 Other disorders of lung: Secondary | ICD-10-CM | POA: Diagnosis not present

## 2015-04-16 DIAGNOSIS — J449 Chronic obstructive pulmonary disease, unspecified: Secondary | ICD-10-CM | POA: Diagnosis not present

## 2015-04-16 DIAGNOSIS — R0602 Shortness of breath: Secondary | ICD-10-CM | POA: Diagnosis not present

## 2015-04-17 DIAGNOSIS — E781 Pure hyperglyceridemia: Secondary | ICD-10-CM | POA: Diagnosis not present

## 2015-04-24 DIAGNOSIS — E782 Mixed hyperlipidemia: Secondary | ICD-10-CM | POA: Diagnosis not present

## 2015-04-24 DIAGNOSIS — J449 Chronic obstructive pulmonary disease, unspecified: Secondary | ICD-10-CM | POA: Diagnosis not present

## 2015-04-24 DIAGNOSIS — I10 Essential (primary) hypertension: Secondary | ICD-10-CM | POA: Diagnosis not present

## 2015-05-11 DIAGNOSIS — J449 Chronic obstructive pulmonary disease, unspecified: Secondary | ICD-10-CM | POA: Diagnosis not present

## 2015-05-11 DIAGNOSIS — G4733 Obstructive sleep apnea (adult) (pediatric): Secondary | ICD-10-CM | POA: Diagnosis not present

## 2015-05-11 DIAGNOSIS — R0602 Shortness of breath: Secondary | ICD-10-CM | POA: Diagnosis not present

## 2015-05-11 DIAGNOSIS — J984 Other disorders of lung: Secondary | ICD-10-CM | POA: Diagnosis not present

## 2015-05-17 ENCOUNTER — Ambulatory Visit (INDEPENDENT_AMBULATORY_CARE_PROVIDER_SITE_OTHER): Payer: Commercial Managed Care - HMO | Admitting: Internal Medicine

## 2015-05-17 ENCOUNTER — Encounter: Payer: Self-pay | Admitting: Internal Medicine

## 2015-05-17 VITALS — BP 142/78 | HR 99 | Wt 158.0 lb

## 2015-05-17 DIAGNOSIS — J449 Chronic obstructive pulmonary disease, unspecified: Secondary | ICD-10-CM

## 2015-05-17 DIAGNOSIS — R0602 Shortness of breath: Secondary | ICD-10-CM | POA: Diagnosis not present

## 2015-05-17 MED ORDER — PREDNISONE 20 MG PO TABS: 10.0000 mg | ORAL_TABLET | Freq: Every day | ORAL | Status: DC

## 2015-05-17 MED ORDER — FLUTICASONE PROPIONATE HFA 220 MCG/ACT IN AERO: 2.0000 | INHALATION_SPRAY | Freq: Two times a day (BID) | RESPIRATORY_TRACT | Status: DC

## 2015-05-17 MED ORDER — NICOTINE 14 MG/24HR TD PT24: 14.0000 mg | MEDICATED_PATCH | TRANSDERMAL | Status: AC

## 2015-05-17 MED ORDER — GLYCOPYRROLATE-FORMOTEROL 9-4.8 MCG/ACT IN AERO: 2.0000 | INHALATION_SPRAY | Freq: Two times a day (BID) | RESPIRATORY_TRACT | Status: DC

## 2015-05-17 NOTE — Patient Instructions (Signed)
Chronic Obstructive Pulmonary Disease Chronic obstructive pulmonary disease (COPD) is a common lung condition in which airflow from the lungs is limited. COPD is a general term that can be used to describe many different lung problems that limit airflow, including both chronic bronchitis and emphysema. If you have COPD, your lung function will probably never return to normal, but there are measures you can take to improve lung function and make yourself feel better. CAUSES   Smoking (common).  Exposure to secondhand smoke.  Genetic problems.  Chronic inflammatory lung diseases or recurrent infections. SYMPTOMS  Shortness of breath, especially with physical activity.  Deep, persistent (chronic) cough with a large amount of thick mucus.  Wheezing.  Rapid breaths (tachypnea).  Gray or bluish discoloration (cyanosis) of the skin, especially in your fingers, toes, or lips.  Fatigue.  Weight loss.  Frequent infections or episodes when breathing symptoms become much worse (exacerbations).  Chest tightness. DIAGNOSIS Your health care provider will take a medical history and perform a physical examination to diagnose COPD. Additional tests for COPD may include:  Lung (pulmonary) function tests.  Chest X-ray.  CT scan.  Blood tests. TREATMENT  Treatment for COPD may include:  Inhaler and nebulizer medicines. These help manage the symptoms of COPD and make your breathing more comfortable.  Supplemental oxygen. Supplemental oxygen is only helpful if you have a low oxygen level in your blood.  Exercise and physical activity. These are beneficial for nearly all people with COPD.  Lung surgery or transplant.  Nutrition therapy to gain weight, if you are underweight.  Pulmonary rehabilitation. This may involve working with a team of health care providers and specialists, such as respiratory, occupational, and physical therapists. HOME CARE INSTRUCTIONS  Take all medicines  (inhaled or pills) as directed by your health care provider.  Avoid over-the-counter medicines or cough syrups that dry up your airway (such as antihistamines) and slow down the elimination of secretions unless instructed otherwise by your health care provider.  If you are a smoker, the most important thing that you can do is stop smoking. Continuing to smoke will cause further lung damage and breathing trouble. Ask your health care provider for help with quitting smoking. He or she can direct you to community resources or hospitals that provide support.  Avoid exposure to irritants such as smoke, chemicals, and fumes that aggravate your breathing.  Use oxygen therapy and pulmonary rehabilitation if directed by your health care provider. If you require home oxygen therapy, ask your health care provider whether you should purchase a pulse oximeter to measure your oxygen level at home.  Avoid contact with individuals who have a contagious illness.  Avoid extreme temperature and humidity changes.  Eat healthy foods. Eating smaller, more frequent meals and resting before meals may help you maintain your strength.  Stay active, but balance activity with periods of rest. Exercise and physical activity will help you maintain your ability to do things you want to do.  Preventing infection and hospitalization is very important when you have COPD. Make sure to receive all the vaccines your health care provider recommends, especially the pneumococcal and influenza vaccines. Ask your health care provider whether you need a pneumonia vaccine.  Learn and use relaxation techniques to manage stress.  Learn and use controlled breathing techniques as directed by your health care provider. Controlled breathing techniques include:  Pursed lip breathing. Start by breathing in (inhaling) through your nose for 1 second. Then, purse your lips as if you were   going to whistle and breathe out (exhale) through the  pursed lips for 2 seconds.  Diaphragmatic breathing. Start by putting one hand on your abdomen just above your waist. Inhale slowly through your nose. The hand on your abdomen should move out. Then purse your lips and exhale slowly. You should be able to feel the hand on your abdomen moving in as you exhale.  Learn and use controlled coughing to clear mucus from your lungs. Controlled coughing is a series of short, progressive coughs. The steps of controlled coughing are: 1. Lean your head slightly forward. 2. Breathe in deeply using diaphragmatic breathing. 3. Try to hold your breath for 3 seconds. 4. Keep your mouth slightly open while coughing twice. 5. Spit any mucus out into a tissue. 6. Rest and repeat the steps once or twice as needed. SEEK MEDICAL CARE IF:  You are coughing up more mucus than usual.  There is a change in the color or thickness of your mucus.  Your breathing is more labored than usual.  Your breathing is faster than usual. SEEK IMMEDIATE MEDICAL CARE IF:  You have shortness of breath while you are resting.  You have shortness of breath that prevents you from:  Being able to talk.  Performing your usual physical activities.  You have chest pain lasting longer than 5 minutes.  Your skin color is more cyanotic than usual.  You measure low oxygen saturations for longer than 5 minutes with a pulse oximeter. MAKE SURE YOU:  Understand these instructions.  Will watch your condition.  Will get help right away if you are not doing well or get worse.   This information is not intended to replace advice given to you by your health care provider. Make sure you discuss any questions you have with your health care provider.   Document Released: 10/15/2004 Document Revised: 01/26/2014 Document Reviewed: 09/01/2012 Elsevier Interactive Patient Education 2016 [employer]  

## 2015-05-17 NOTE — Progress Notes (Signed)
  Subjective:    Patient ID: Dennis Barajas, male    DOB: 08-05-1960, 55 y.o.   MRN: [id number]  Synopsis: Dennis Barajas first saw [facility] in may of 2013 for shortness of breath. 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  He was also diagnosed with COPD and in 2011 his FEV1 was 53% predicted. He continues to use cigarettes on a regular basis. 1 pack per day, patient continues to smoke despite aggressive counseling Uses oxygen with exertion  WU:[id number]:chronic SOB,DOE CPAP machine working well now Follow up for COPD   HPI  patient with chronic SOB,DOE, patient continues to smoke, patient tried Daliresp and did NOT feel well, felt dizzy and has stopped taking Patient wants to go up in prednisone dose to 20 mg. Uses CPAP for OSA-settings have been adjusted and he feels better, Pressure increased to 16 Uses oxygen with exertion, and at night No signs of infection at this time   Review of Systems  Constitutional: Positive for fatigue. Negative for fever, chills and diaphoresis.  HENT: Negative for congestion, postnasal drip, rhinorrhea and sinus pressure.   Respiratory: Positive for shortness of breath. Negative for cough, chest tightness and wheezing.   Cardiovascular: Negative for chest pain and leg swelling.  Neurological: Negative for dizziness.  All other systems reviewed and are negative.  BP 142/78 mmHg  Pulse 99  Wt 158 lb (71.668 kg)  SpO2 93%     Objective:   Physical Exam  Constitutional: He is oriented to person, place, and time. No distress.  Eyes: Conjunctivae are normal. Pupils are equal, round, and reactive to light.  Neck: Neck supple.  Pulmonary/Chest: Effort normal. No respiratory distress. He has no wheezes. He has no rales.  Musculoskeletal: He exhibits no edema.  Neurological: He is alert and oriented to person, place, and time. No cranial nerve deficit.  Skin: Skin is warm. He is not  diaphoretic.          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  COPD (chronic obstructive pulmonary disease)-moderate grade D-continues to smoke  1.will increase prednisone up  to 20 mg daily, will consider long term azithromycin therapy at next visit 2.stop Daliresp patient did NOT tolerate  3.atrovent for rhinitis 4.robitussin with codeine for cough as needed 5.oxygen as needed 6.will stop advair and start flovent and AC/LABA with Bevespi  DIP and RBILD-chronic prednisone use now 20 mg daily Biopsy-proven, again, he refuses to try to help himself because he continues to keep smoking. 1.Explained to him at length again today the importance of quitting smoking 2.Continue Bactrim along with prednisone for PCP prophylaxis   TOBACCO ABUSE Counseled again to quit smoking at length.  -nicotine patches ordered  OSA -after adjustments made he feels much better  Follow up 3 months   The Patient requires high complexity decision making for assessment and support, frequent evaluation and titration of therapies. Patient/Family are satisfied with Plan of action and management. All questions answered  Lucie LeatherKurian David Bobbette Eakes, M.D.  [facility]  Medical Director [facility] Medical Director [facility] Card[facility] Department

## 2015-05-20 ENCOUNTER — Telehealth: Payer: Self-pay | Admitting: Internal Medicine

## 2015-05-20 NOTE — Telephone Encounter (Signed)
Please advise for a cheaper med than Bevespi. States copay is too much. Can't give copay coupon due to insurance is Medicare.

## 2015-05-20 NOTE — Telephone Encounter (Signed)
Patient wants a cheaper alternative or samples for bevespi .  Insurance will not cover .  Please call Wife Dennis Barajas.

## 2015-05-21 NOTE — Telephone Encounter (Signed)
Can you tell me what meds he can choose from?

## 2015-05-21 NOTE — Telephone Encounter (Signed)
LMOM for wife to call back after she calls insurance company to see what is covered in place of Bevespi at a cheaper copay. Will await call back.

## 2015-05-22 NOTE — Telephone Encounter (Signed)
Wife has asked us to change pt's meds as below. Per his insurance these are cheaper.  Flovent-change to Budesonide neb Proair-change to Ventolin Bevespi-change to Ipratropium HFA inhaler.  Please advise if this is ok to send in.

## 2015-05-23 MED ORDER — IPRATROPIUM BROMIDE HFA 17 MCG/ACT IN AERS: 2.0000 | INHALATION_SPRAY | Freq: Four times a day (QID) | RESPIRATORY_TRACT | Status: DC

## 2015-05-23 MED ORDER — BUDESONIDE 0.25 MG/2ML IN SUSP: 2.0000 mL | Freq: Two times a day (BID) | RESPIRATORY_TRACT | Status: AC

## 2015-05-23 MED ORDER — ALBUTEROL SULFATE HFA 108 (90 BASE) MCG/ACT IN AERS: 2.0000 | INHALATION_SPRAY | Freq: Four times a day (QID) | RESPIRATORY_TRACT | Status: DC | PRN

## 2015-05-23 NOTE — Telephone Encounter (Signed)
meds sent. Nothing further needed.

## 2015-05-23 NOTE — Telephone Encounter (Signed)
yes

## 2015-06-10 DIAGNOSIS — J984 Other disorders of lung: Secondary | ICD-10-CM | POA: Diagnosis not present

## 2015-06-10 DIAGNOSIS — R0602 Shortness of breath: Secondary | ICD-10-CM | POA: Diagnosis not present

## 2015-06-10 DIAGNOSIS — J449 Chronic obstructive pulmonary disease, unspecified: Secondary | ICD-10-CM | POA: Diagnosis not present

## 2015-06-10 DIAGNOSIS — G4733 Obstructive sleep apnea (adult) (pediatric): Secondary | ICD-10-CM | POA: Diagnosis not present

## 2015-06-14 ENCOUNTER — Other Ambulatory Visit: Payer: Self-pay | Admitting: *Deleted

## 2015-06-14 MED ORDER — SULFAMETHOXAZOLE-TRIMETHOPRIM 800-160 MG PO TABS: ORAL_TABLET | ORAL | Status: DC

## 2015-06-16 DIAGNOSIS — R0602 Shortness of breath: Secondary | ICD-10-CM | POA: Diagnosis not present

## 2015-06-16 DIAGNOSIS — J449 Chronic obstructive pulmonary disease, unspecified: Secondary | ICD-10-CM | POA: Diagnosis not present

## 2015-06-19 ENCOUNTER — Telehealth: Payer: Self-pay | Admitting: Internal Medicine

## 2015-06-19 DIAGNOSIS — J449 Chronic obstructive pulmonary disease, unspecified: Secondary | ICD-10-CM

## 2015-06-19 MED ORDER — PREDNISONE 20 MG PO TABS: 20.0000 mg | ORAL_TABLET | Freq: Every day | ORAL | Status: DC

## 2015-06-19 NOTE — Telephone Encounter (Signed)
Pharmacy calling stating Prednisone 20 mg is to be taken  Half  1 a day Doctor told them to take 1 full tablet a day They have been doing that 1 tablet and  if that is correct they need a new prescription  Pt is at pharmacy.  Please advise

## 2015-06-19 NOTE — Telephone Encounter (Signed)
Sent new rx for prednisone 20mg  daily to pharmacy. Nothing further needed.

## 2015-06-27 ENCOUNTER — Other Ambulatory Visit: Payer: Self-pay | Admitting: Cardiovascular Disease

## 2015-07-08 ENCOUNTER — Telehealth: Payer: Self-pay | Admitting: Cardiovascular Disease

## 2015-07-08 NOTE — Telephone Encounter (Signed)
Spoke w/ pt's wife.   She reports pt has been c/o ankle edema x 1 month, seems to be worsening. About a month ago, pt ate some country ham and his ankles swelled up and "turned purple". She reports that pt's feet & ankles will swell during the day, sx improve overnight, but have not completely resolved. Advised her to have pt follow a low sodium diet, obtain compression hose or ACE bandages, and keep feet elevated when sitting.  Discussed at length w/ her the affects of sodium, fluid, and diet. She reports that pt does not drink water, he drinks about a 1/2 gallon of sweet tea each day and "pees all day and night". Advised her to contact pt's PCP, as well to see about getting complete eval and checking BG.  She is agreeable and will call back if sx do not improve or if we can be of further assistance.

## 2015-07-08 NOTE — Telephone Encounter (Signed)
Left message for Sharon to call back

## 2015-07-08 NOTE — Telephone Encounter (Signed)
Pt c/o swelling: STAT is pt has developed SOB within 24 hours  1. How long have you been experiencing swelling? 1 month  2. Where is the swelling located? Ankles and below  3.  Are you currently taking a "fluid pill"? no  4.  Are you currently SOB? Has been for a long time  5.  Have you traveled recently?no   If not able to reach on cell, may call on work phone [phone] after 1 pm

## 2015-07-11 DIAGNOSIS — R0602 Shortness of breath: Secondary | ICD-10-CM | POA: Diagnosis not present

## 2015-07-11 DIAGNOSIS — J984 Other disorders of lung: Secondary | ICD-10-CM | POA: Diagnosis not present

## 2015-07-11 DIAGNOSIS — J449 Chronic obstructive pulmonary disease, unspecified: Secondary | ICD-10-CM | POA: Diagnosis not present

## 2015-07-11 DIAGNOSIS — G4733 Obstructive sleep apnea (adult) (pediatric): Secondary | ICD-10-CM | POA: Diagnosis not present

## 2015-07-17 DIAGNOSIS — Z9861 Coronary angioplasty status: Secondary | ICD-10-CM | POA: Diagnosis not present

## 2015-07-17 DIAGNOSIS — I2510 Atherosclerotic heart disease of native coronary artery without angina pectoris: Secondary | ICD-10-CM | POA: Diagnosis not present

## 2015-07-17 DIAGNOSIS — E782 Mixed hyperlipidemia: Secondary | ICD-10-CM | POA: Diagnosis not present

## 2015-07-17 DIAGNOSIS — J849 Interstitial pulmonary disease, unspecified: Secondary | ICD-10-CM | POA: Diagnosis not present

## 2015-07-17 DIAGNOSIS — R600 Localized edema: Secondary | ICD-10-CM | POA: Diagnosis not present

## 2015-07-24 DIAGNOSIS — E782 Mixed hyperlipidemia: Secondary | ICD-10-CM | POA: Diagnosis not present

## 2015-07-24 DIAGNOSIS — J449 Chronic obstructive pulmonary disease, unspecified: Secondary | ICD-10-CM | POA: Diagnosis not present

## 2015-07-24 DIAGNOSIS — I10 Essential (primary) hypertension: Secondary | ICD-10-CM | POA: Diagnosis not present

## 2015-07-24 DIAGNOSIS — D649 Anemia, unspecified: Secondary | ICD-10-CM | POA: Diagnosis not present

## 2015-07-24 DIAGNOSIS — F325 Major depressive disorder, single episode, in full remission: Secondary | ICD-10-CM | POA: Diagnosis not present

## 2015-07-25 DIAGNOSIS — D649 Anemia, unspecified: Secondary | ICD-10-CM | POA: Diagnosis not present

## 2015-07-26 ENCOUNTER — Ambulatory Visit (INDEPENDENT_AMBULATORY_CARE_PROVIDER_SITE_OTHER): Payer: Commercial Managed Care - HMO | Admitting: Internal Medicine

## 2015-07-26 ENCOUNTER — Encounter: Payer: Self-pay | Admitting: Internal Medicine

## 2015-07-26 ENCOUNTER — Encounter (INDEPENDENT_AMBULATORY_CARE_PROVIDER_SITE_OTHER): Payer: Self-pay

## 2015-07-26 VITALS — BP 120/72 | HR 103 | Wt 156.0 lb

## 2015-07-26 DIAGNOSIS — R0602 Shortness of breath: Secondary | ICD-10-CM | POA: Diagnosis not present

## 2015-07-26 DIAGNOSIS — J309 Allergic rhinitis, unspecified: Secondary | ICD-10-CM

## 2015-07-26 NOTE — Patient Instructions (Signed)
1.ENT referral for chronic allergic rhintis 2.needs oxygen with exertion and at night 3.check ECHO follo wup with Dr. Mariah MillingGollan 4.continue prednisone and inhalers as prescribed  Chronic Obstructive Pulmonary Disease Chronic obstructive pulmonary disease (COPD) is a common lung condition in which airflow from the lungs is limited. COPD is a general term that can be used to describe many different lung problems that limit airflow, including both chronic bronchitis and emphysema. If you have COPD, your lung function will probably never return to normal, but there are measures you can take to improve lung function and make yourself feel better. CAUSES   Smoking (common).  Exposure to secondhand smoke.  Genetic problems.  Chronic inflammatory lung diseases or recurrent infections. SYMPTOMS  Shortness of breath, especially with physical activity.  Deep, persistent (chronic) cough with a large amount of thick mucus.  Wheezing.  Rapid breaths (tachypnea).  Gray or bluish discoloration (cyanosis) of the skin, especially in your fingers, toes, or lips.  Fatigue.  Weight loss.  Frequent infections or episodes when breathing symptoms become much worse (exacerbations).  Chest tightness. DIAGNOSIS Your health care provider will take a medical history and perform a physical examination to diagnose COPD. Additional tests for COPD may include:  Lung (pulmonary) function tests.  Chest X-ray.  CT scan.  Blood tests. TREATMENT  Treatment for COPD may include:  Inhaler and nebulizer medicines. These help manage the symptoms of COPD and make your breathing more comfortable.  Supplemental oxygen. Supplemental oxygen is only helpful if you have a low oxygen level in your blood.  Exercise and physical activity. These are beneficial for nearly all people with COPD.  Lung surgery or transplant.  Nutrition therapy to gain weight, if you are underweight.  Pulmonary rehabilitation. This may  involve working with a team of health care providers and specialists, such as respiratory, occupational, and physical therapists. HOME CARE INSTRUCTIONS  Take all medicines (inhaled or pills) as directed by your health care provider.  Avoid over-the-counter medicines or cough syrups that dry up your airway (such as antihistamines) and slow down the elimination of secretions unless instructed otherwise by your health care provider.  If you are a smoker, the most important thing that you can do is stop smoking. Continuing to smoke will cause further lung damage and breathing trouble. Ask your health care provider for help with quitting smoking. He or she can direct you to community resources or hospitals that provide support.  Avoid exposure to irritants such as smoke, chemicals, and fumes that aggravate your breathing.  Use oxygen therapy and pulmonary rehabilitation if directed by your health care provider. If you require home oxygen therapy, ask your health care provider whether you should purchase a pulse oximeter to measure your oxygen level at home.  Avoid contact with individuals who have a contagious illness.  Avoid extreme temperature and humidity changes.  Eat healthy foods. Eating smaller, more frequent meals and resting before meals may help you maintain your strength.  Stay active, but balance activity with periods of rest. Exercise and physical activity will help you maintain your ability to do things you want to do.  Preventing infection and hospitalization is very important when you have COPD. Make sure to receive all the vaccines your health care provider recommends, especially the pneumococcal and influenza vaccines. Ask your health care provider whether you need a pneumonia vaccine.  Learn and use relaxation techniques to manage stress.  Learn and use controlled breathing techniques as directed by your health care provider.  Controlled breathing techniques include:  Pursed  lip breathing. Start by breathing in (inhaling) through your nose for 1 second. Then, purse your lips as if you were going to whistle and breathe out (exhale) through the pursed lips for 2 seconds.  Diaphragmatic breathing. Start by putting one hand on your abdomen just above your waist. Inhale slowly through your nose. The hand on your abdomen should move out. Then purse your lips and exhale slowly. You should be able to feel the hand on your abdomen moving in as you exhale.  Learn and use controlled coughing to clear mucus from your lungs. Controlled coughing is a series of short, progressive coughs. The steps of controlled coughing are:  Lean your head slightly forward.  Breathe in deeply using diaphragmatic breathing.  Try to hold your breath for 3 seconds.  Keep your mouth slightly open while coughing twice.  Spit any mucus out into a tissue.  Rest and repeat the steps once or twice as needed. SEEK MEDICAL CARE IF:  You are coughing up more mucus than usual.  There is a change in the color or thickness of your mucus.  Your breathing is more labored than usual.  Your breathing is faster than usual. SEEK IMMEDIATE MEDICAL CARE IF:  You have shortness of breath while you are resting.  You have shortness of breath that prevents you from:  Being able to talk.  Performing your usual physical activities.  You have chest pain lasting longer than 5 minutes.  Your skin color is more cyanotic than usual.  You measure low oxygen saturations for longer than 5 minutes with a pulse oximeter. MAKE SURE YOU:  Understand these instructions.  Will watch your condition.  Will get help right away if you are not doing well or get worse.   This information is not intended to replace advice given to you by your health care provider. Make sure you discuss any questions you have with your health care provider.   Document Released: 10/15/2004 Document Revised: 01/26/2014 Document  Reviewed: 09/01/2012 Elsevier Interactive Patient Education 2016 Elsevier Inc.  Chronic Respiratory Failure Respiratory failure is when your lungs are not working well and your breathing (respiratory) system fails. When respiratory failure occurs, it is difficult for your lungs to get enough oxygen or get rid of carbon dioxide or both. Respiratory failure can be life threatening.  Respiratory failure can be acute or chronic. Acute respiratory failure is sudden, severe, and requires emergency medical treatment. Chronic respiratory failure is less severe, happens over time, and requires ongoing treatment.  CAUSES  Any problem affecting the heart or lungs can cause respiratory failure. Some of these causes may be:  Chronic bronchitis and emphysema (COPD).  Blood clot going to the lung (pulmonary embolism).  Having water in the lungs caused by heart failure, lung injury, or infection (pulmonary edema).  Collapsed lung (pneumothorax).  Pneumonia.  Pulmonary fibrosis.  Obesity.  Asthma.  Heart failure.  Any type of trauma to the chest that can make breathing difficult.  Nerve or muscle diseases making chest movements difficult. SYMPTOMS  Signs and symptoms of chronic respiratory failure include:  Shortness of breath (dyspnea) with or without activity.  Rapid, fast breathing (tachypnea).  Wheezing.  Fast heart rate.  Bluish color to the fingernail or toenail beds.  Confusion or drowsiness or both. DIAGNOSIS  Initial diagnosis requires a thorough history and a physical exam by your health care provider. Additional tests may include:  Chest X-ray.  CT scan of  your lungs.  Ultrasound to check for blood clots.  Blood tests, such as an arterial blood gas test (ABG). This is a blood test that looks at the oxygen and carbon dioxide levels in your arterial blood.  Your vital signs will be taken. This includes your respiratory rate (how many times a minute you are breathing),  oxygen saturation (this measures the oxygen level in your blood), heart rate, and blood pressure. These numbers help your health care provider determine the next steps.  Electrocardiogram. TREATMENT  Treatment of chronic respiratory failure depends on the cause of the respiratory failure. Treatment can include the following:  Oxygen. Oxygen can be delivered through the following:  Nasal cannula. This is small tubing that goes in your nose to give you oxygen.  Face mask. A face mask covers your nose and mouth to give you oxygen.  Medicine. Different medicines can be given to help with breathing. These can include:  Nebulizers. Nebulizers deliver medicines to open the air passages (bronchodilators). These medicines help to open or relax the airways in the lungs so you can breathe better. They can also help loosen mucus from your lungs.  Diuretics. Diuretic medicines can help you breathe better by getting rid of extra fluid in your body.  Steroids. Steroid medicines can help decrease inflammation in your lungs.  Chest tube. If you have a collapsed lung (pneumothorax), a chest tube is placed to help reinflate the lung.  Noninvasive positive pressure ventilation (NPPV). This is a tight-fitting mask that goes over your nose and mouth. The mask has tubing that is attached to a machine. The machine blows air into the tubing, which helps to keep the tiny air sacs (alveoli) in your lungs open. This machine allows you to breathe on your own.  Ventilator. A ventilator is a breathing machine. When on a ventilator, a breathing tube is put into the lungs. A ventilator is used when you can no longer breathe well enough on your own. You may have low oxygen levels or high carbon dioxide (CO2) levels in your blood. When you are on a ventilator, sedation and pain medicines are given to make you sleep so your lungs can heal. HOME CARE INSTRUCTIONS  Follow your health care provider's directions about medicines  and respiratory therapy.  Quit smoking if you smoke. SEEK MEDICAL CARE IF:  You have increasing shortness of breath and are less functional than you have been.  You have increased sputum, wheezing, coughing, or loss of energy.  You are on oxygen and are requiring more. SEEK IMMEDIATE MEDICAL CARE IF:  Your shortness of breath is significantly worse.  You are unable to say more than a few words without having to catch your breath.  You are much less functional. MAKE SURE YOU:  Understand these instructions.  Will watch your condition.  Will get help right away if you are not doing well or get worse.   This information is not intended to replace advice given to you by your health care provider. Make sure you discuss any questions you have with your health care provider.   Document Released: 01/05/2005 Document Revised: 09/26/2014 Document Reviewed: 11/03/2012 Elsevier Interactive Patient Education [employer]2016 Elsevier Inc.

## 2015-07-26 NOTE — Progress Notes (Signed)
  Subjective:    Patient ID: Dennis Barajas, male    DOB: 1960-11-06, 55 y.o.   MRN: [id number]  Synopsis: Dennis Barajas [facility] in may of 2013 for shortness of breath. 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  He was also diagnosed with COPD and in 2011 his FEV1 was 53% predicted. He continues to use cigarettes on a regular basis. 1 pack per day, patient continues to smoke despite aggressive counseling DOES NOT Uses oxygen with exertion as prescribed  WU:[id number]:chronic SOB,DOE CPAP machine not working well because of sinus problems Follow up for COPD CPAP pressure up to 16   HPI  patient with chronic SOB,DOE, patient continues to smoke, patient tried Daliresp and did NOT feel well, felt dizzy and has stopped taking On chronic prednisone 20 mg daily Uses oxygen with exertion, and at night No signs of infection at this time Still smokes Non-compliant with oxygen  Review of Systems  Constitutional: Positive for fatigue. Negative for fever, chills and diaphoresis.  HENT: Negative for congestion, postnasal drip, rhinorrhea and sinus pressure.   Respiratory: Positive for shortness of breath. Negative for cough, chest tightness and wheezing.   Cardiovascular: Negative for chest pain and leg swelling.  Neurological: Negative for dizziness.  All other systems reviewed and are negative.  BP 120/72 mmHg  Pulse 103  Wt 156 lb (70.761 kg)  SpO2 95%     Objective:   Physical Exam  Constitutional: He is oriented to person, place, and time. No distress.  Eyes: Conjunctivae are normal. Pupils are equal, round, and reactive to light.  Neck: Neck supple.  Pulmonary/Chest: Effort normal. No respiratory distress. He has no wheezes. He has no rales.  Musculoskeletal: He exhibits no edema.  Neurological: He is alert and oriented to person, place, and time. No cranial nerve deficit.  Skin: Skin is warm. He is not  diaphoretic.          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 With chronic resp failure with hypoxia  COPD (chronic obstructive pulmonary disease)-moderate grade D-continues to smoke  1. prednisone  20 mg daily,  2.stop Daliresp patient did NOT tolerate  3.atrovent for rhinitis 4.robitussin with codeine for cough as needed 5.oxygen as needed with exertion and at night 6. flovent and AC/LABA with Bevespi 7.ENT referral for sinus problems 8.check ECHO for LE swelling- on lasix, follow up cardiology pending  DIP and RBILD-chronic prednisone use now 20 mg daily Biopsy-proven, again, he refuses to try to help himself because he continues to keep smoking. 1.Explained to him at length again today the importance of quitting smoking 2.Continue Bactrim along with prednisone for PCP prophylaxis   TOBACCO ABUSE Counseled again to quit smoking at length.   OSA Continue CPAP therapy  Follow up 3 months   The Patient requires high complexity decision making for assessment and support, frequent evaluation and titration of therapies. Patient/Family are satisfied with Plan of action and management. All questions answered  Lucie LeatherKurian David Braydyn Schultes, M.D.  [facility]  Medical Director [facility] Medical Director [facility] Cardio[facility] Department

## 2015-08-10 DIAGNOSIS — G4733 Obstructive sleep apnea (adult) (pediatric): Secondary | ICD-10-CM | POA: Diagnosis not present

## 2015-08-10 DIAGNOSIS — J449 Chronic obstructive pulmonary disease, unspecified: Secondary | ICD-10-CM | POA: Diagnosis not present

## 2015-08-10 DIAGNOSIS — R0602 Shortness of breath: Secondary | ICD-10-CM | POA: Diagnosis not present

## 2015-08-10 DIAGNOSIS — J984 Other disorders of lung: Secondary | ICD-10-CM | POA: Diagnosis not present

## 2015-08-13 DIAGNOSIS — D649 Anemia, unspecified: Secondary | ICD-10-CM | POA: Diagnosis not present

## 2015-08-16 ENCOUNTER — Other Ambulatory Visit: Payer: Self-pay

## 2015-08-16 ENCOUNTER — Other Ambulatory Visit: Payer: Self-pay | Admitting: Pulmonary Disease

## 2015-08-16 ENCOUNTER — Ambulatory Visit (INDEPENDENT_AMBULATORY_CARE_PROVIDER_SITE_OTHER): Payer: Commercial Managed Care - HMO

## 2015-08-16 DIAGNOSIS — R0602 Shortness of breath: Secondary | ICD-10-CM | POA: Diagnosis not present

## 2015-08-16 DIAGNOSIS — J449 Chronic obstructive pulmonary disease, unspecified: Secondary | ICD-10-CM | POA: Diagnosis not present

## 2015-08-20 DIAGNOSIS — F324 Major depressive disorder, single episode, in partial remission: Secondary | ICD-10-CM | POA: Diagnosis not present

## 2015-09-02 ENCOUNTER — Telehealth: Payer: Self-pay | Admitting: Internal Medicine

## 2015-09-02 NOTE — Telephone Encounter (Signed)
Pt wife calling having a medication question.  She went to pharmacy and they had patient taking Prednisone 0.5 mg 10 mg total a day Suppose to take 20 mg a day  Just needs us to call in and verify to pharmacy [facility] [place]

## 2015-09-02 NOTE — Telephone Encounter (Signed)
Called [facility] and pharmacists states they had to different Prednisone on file. [facility]formed [facility] that pt should be taking 20mg  daily. She states they have a rx from June for that dose. Called wife and informed her of their repsonse. She stated that [facility] only gave them 15 tabs thinking pt should take 1/2 tab. Informed her that once he finished what they have on hand to go pick up next rx with 20mg  daily and if there is any question for her to call me or have [facility] to call. Nothing further needed.

## 2015-09-10 DIAGNOSIS — G4733 Obstructive sleep apnea (adult) (pediatric): Secondary | ICD-10-CM | POA: Diagnosis not present

## 2015-09-10 DIAGNOSIS — R0602 Shortness of breath: Secondary | ICD-10-CM | POA: Diagnosis not present

## 2015-09-10 DIAGNOSIS — J984 Other disorders of lung: Secondary | ICD-10-CM | POA: Diagnosis not present

## 2015-09-10 DIAGNOSIS — J449 Chronic obstructive pulmonary disease, unspecified: Secondary | ICD-10-CM | POA: Diagnosis not present

## 2015-09-16 DIAGNOSIS — R0602 Shortness of breath: Secondary | ICD-10-CM | POA: Diagnosis not present

## 2015-09-16 DIAGNOSIS — J449 Chronic obstructive pulmonary disease, unspecified: Secondary | ICD-10-CM | POA: Diagnosis not present

## 2015-10-02 DIAGNOSIS — T65222A Toxic effect of tobacco cigarettes, intentional self-harm, initial encounter: Secondary | ICD-10-CM | POA: Diagnosis not present

## 2015-10-02 DIAGNOSIS — J301 Allergic rhinitis due to pollen: Secondary | ICD-10-CM | POA: Diagnosis not present

## 2015-10-02 DIAGNOSIS — J329 Chronic sinusitis, unspecified: Secondary | ICD-10-CM | POA: Diagnosis not present

## 2015-10-02 DIAGNOSIS — L723 Sebaceous cyst: Secondary | ICD-10-CM | POA: Diagnosis not present

## 2015-10-04 ENCOUNTER — Ambulatory Visit: Payer: Commercial Managed Care - HMO | Admitting: Cardiovascular Disease

## 2015-10-04 ENCOUNTER — Encounter: Payer: Self-pay | Admitting: *Deleted

## 2015-10-11 DIAGNOSIS — J984 Other disorders of lung: Secondary | ICD-10-CM | POA: Diagnosis not present

## 2015-10-11 DIAGNOSIS — R0602 Shortness of breath: Secondary | ICD-10-CM | POA: Diagnosis not present

## 2015-10-11 DIAGNOSIS — J449 Chronic obstructive pulmonary disease, unspecified: Secondary | ICD-10-CM | POA: Diagnosis not present

## 2015-10-11 DIAGNOSIS — G4733 Obstructive sleep apnea (adult) (pediatric): Secondary | ICD-10-CM | POA: Diagnosis not present

## 2015-10-17 DIAGNOSIS — J449 Chronic obstructive pulmonary disease, unspecified: Secondary | ICD-10-CM | POA: Diagnosis not present

## 2015-10-17 DIAGNOSIS — R0602 Shortness of breath: Secondary | ICD-10-CM | POA: Diagnosis not present

## 2015-11-05 ENCOUNTER — Other Ambulatory Visit: Payer: Self-pay | Admitting: *Deleted

## 2015-11-05 MED ORDER — SULFAMETHOXAZOLE-TRIMETHOPRIM 800-160 MG PO TABS: ORAL_TABLET | ORAL | 0 refills | Status: DC

## 2015-11-06 ENCOUNTER — Other Ambulatory Visit: Payer: Self-pay | Admitting: Pulmonary Disease

## 2015-11-14 ENCOUNTER — Other Ambulatory Visit: Payer: Self-pay | Admitting: *Deleted

## 2015-11-14 MED ORDER — FLUTICASONE-SALMETEROL 115-21 MCG/ACT IN AERO: 2.0000 | INHALATION_SPRAY | Freq: Two times a day (BID) | RESPIRATORY_TRACT | 3 refills | Status: AC

## 2015-11-16 DIAGNOSIS — R0602 Shortness of breath: Secondary | ICD-10-CM | POA: Diagnosis not present

## 2015-11-16 DIAGNOSIS — J449 Chronic obstructive pulmonary disease, unspecified: Secondary | ICD-10-CM | POA: Diagnosis not present

## 2015-12-02 DIAGNOSIS — E7800 Pure hypercholesterolemia, unspecified: Secondary | ICD-10-CM | POA: Diagnosis not present

## 2015-12-02 DIAGNOSIS — D649 Anemia, unspecified: Secondary | ICD-10-CM | POA: Diagnosis not present

## 2015-12-02 DIAGNOSIS — I10 Essential (primary) hypertension: Secondary | ICD-10-CM | POA: Diagnosis not present

## 2015-12-02 DIAGNOSIS — F324 Major depressive disorder, single episode, in partial remission: Secondary | ICD-10-CM | POA: Diagnosis not present

## 2015-12-17 DIAGNOSIS — R0602 Shortness of breath: Secondary | ICD-10-CM | POA: Diagnosis not present

## 2015-12-17 DIAGNOSIS — J449 Chronic obstructive pulmonary disease, unspecified: Secondary | ICD-10-CM | POA: Diagnosis not present

## 2016-01-16 DIAGNOSIS — R0602 Shortness of breath: Secondary | ICD-10-CM | POA: Diagnosis not present

## 2016-01-16 DIAGNOSIS — J449 Chronic obstructive pulmonary disease, unspecified: Secondary | ICD-10-CM | POA: Diagnosis not present

## 2016-01-21 DIAGNOSIS — H1033 Unspecified acute conjunctivitis, bilateral: Secondary | ICD-10-CM | POA: Diagnosis not present

## 2016-01-21 DIAGNOSIS — I10 Essential (primary) hypertension: Secondary | ICD-10-CM | POA: Diagnosis not present

## 2016-01-21 DIAGNOSIS — E782 Mixed hyperlipidemia: Secondary | ICD-10-CM | POA: Diagnosis not present

## 2016-01-21 DIAGNOSIS — J449 Chronic obstructive pulmonary disease, unspecified: Secondary | ICD-10-CM | POA: Diagnosis not present

## 2016-02-10 ENCOUNTER — Telehealth: Payer: Self-pay

## 2016-02-10 NOTE — Telephone Encounter (Signed)
3 attempts to schedule fu from recall list.  Deleting recall.  °

## 2016-02-16 DIAGNOSIS — J449 Chronic obstructive pulmonary disease, unspecified: Secondary | ICD-10-CM | POA: Diagnosis not present

## 2016-02-16 DIAGNOSIS — R0602 Shortness of breath: Secondary | ICD-10-CM | POA: Diagnosis not present

## 2016-02-20 ENCOUNTER — Other Ambulatory Visit: Payer: Self-pay | Admitting: *Deleted

## 2016-02-20 MED ORDER — SULFAMETHOXAZOLE-TRIMETHOPRIM 800-160 MG PO TABS: ORAL_TABLET | ORAL | 0 refills | Status: DC

## 2016-03-18 DIAGNOSIS — R0602 Shortness of breath: Secondary | ICD-10-CM | POA: Diagnosis not present

## 2016-03-18 DIAGNOSIS — J449 Chronic obstructive pulmonary disease, unspecified: Secondary | ICD-10-CM | POA: Diagnosis not present

## 2016-04-15 DIAGNOSIS — R0602 Shortness of breath: Secondary | ICD-10-CM | POA: Diagnosis not present

## 2016-04-15 DIAGNOSIS — J449 Chronic obstructive pulmonary disease, unspecified: Secondary | ICD-10-CM | POA: Diagnosis not present

## 2016-05-07 ENCOUNTER — Other Ambulatory Visit: Payer: Self-pay | Admitting: *Deleted

## 2016-05-07 DIAGNOSIS — J449 Chronic obstructive pulmonary disease, unspecified: Secondary | ICD-10-CM

## 2016-05-07 MED ORDER — PREDNISONE 20 MG PO TABS: 20.0000 mg | ORAL_TABLET | Freq: Every day | ORAL | 0 refills | Status: DC

## 2016-05-16 DIAGNOSIS — R0602 Shortness of breath: Secondary | ICD-10-CM | POA: Diagnosis not present

## 2016-05-16 DIAGNOSIS — J449 Chronic obstructive pulmonary disease, unspecified: Secondary | ICD-10-CM | POA: Diagnosis not present

## 2016-06-15 DIAGNOSIS — J449 Chronic obstructive pulmonary disease, unspecified: Secondary | ICD-10-CM | POA: Diagnosis not present

## 2016-06-15 DIAGNOSIS — R0602 Shortness of breath: Secondary | ICD-10-CM | POA: Diagnosis not present

## 2016-06-16 ENCOUNTER — Other Ambulatory Visit: Payer: Self-pay | Admitting: *Deleted

## 2016-06-16 MED ORDER — PREDNISONE 20 MG PO TABS: 20.0000 mg | ORAL_TABLET | Freq: Every day | ORAL | 0 refills | Status: DC

## 2016-06-18 DIAGNOSIS — F325 Major depressive disorder, single episode, in full remission: Secondary | ICD-10-CM | POA: Diagnosis not present

## 2016-06-18 DIAGNOSIS — J441 Chronic obstructive pulmonary disease with (acute) exacerbation: Secondary | ICD-10-CM | POA: Diagnosis not present

## 2016-06-18 DIAGNOSIS — Z0000 Encounter for general adult medical examination without abnormal findings: Secondary | ICD-10-CM | POA: Diagnosis not present

## 2016-06-18 DIAGNOSIS — I10 Essential (primary) hypertension: Secondary | ICD-10-CM | POA: Diagnosis not present

## 2016-06-29 ENCOUNTER — Ambulatory Visit (INDEPENDENT_AMBULATORY_CARE_PROVIDER_SITE_OTHER): Payer: Medicare HMO | Admitting: Internal Medicine

## 2016-06-29 ENCOUNTER — Encounter: Payer: Self-pay | Admitting: Internal Medicine

## 2016-06-29 VITALS — BP 128/78 | HR 98 | Resp 16 | Ht 71.0 in | Wt 142.0 lb

## 2016-06-29 DIAGNOSIS — J9611 Chronic respiratory failure with hypoxia: Secondary | ICD-10-CM | POA: Diagnosis not present

## 2016-06-29 MED ORDER — PREDNISONE 20 MG PO TABS: 40.0000 mg | ORAL_TABLET | Freq: Every day | ORAL | 5 refills | Status: AC

## 2016-06-29 NOTE — Progress Notes (Signed)
Subjective:    Patient ID: Dennis Barajas, male    DOB: [birth year], 56 y.o.   MRN: [id number]  Synopsis: Morene RankinsJeffrey Martire first saw the [facility] in may of 2013 for shortness of breath. In 2009 he underwent an [facility] which showed DIP and RB ILD.  He was also diagnosed with COPD and in 2011 his FEV1 was 53% predicted. He continues to use cigarettes on a regular basis. 1 pack per day, patient continues to smoke despite aggressive counseling DOES NOT Uses oxygen with exertion as prescribed  WU:JWJXBJYCC:chronic SOB,DOE    HPI  patient with chronic SOB,DOE, patient continues to smoke, patient tried Daliresp and did NOT feel well, felt dizzy and has stopped taking On chronic prednisone 20 mg daily-wants to increase to 40 mg daily Uses oxygen at night No signs of infection at this time Still smokes Non-compliant with oxygen CPAP machine is compliant Follow up for COPD CPAP pressure up to 16 Has wheezing and still smokes    Review of Systems  Constitutional: Positive for fatigue. Negative for chills, diaphoresis and fever.  HENT: Negative for congestion, postnasal drip, rhinorrhea and sinus pressure.   Respiratory: Positive for shortness of breath and wheezing. Negative for cough and chest tightness.   Cardiovascular: Negative for chest pain and leg swelling.  Neurological: Negative for dizziness.  All other systems reviewed and are negative.  BP 128/78 (BP Location: Right Arm, Cuff Size: Normal)   Pulse 98   Resp 16   Ht 5\' 11"  (1.803 m)   Wt 142 lb (64.4 kg)   SpO2 (!) 88%   BMI 19.80 kg/m      Objective:   Physical Exam  Constitutional: He is oriented to person, place, and time. No distress.  Eyes: Conjunctivae are normal. Pupils are equal, round, and reactive to light.  Neck: Neck supple.  Pulmonary/Chest: Effort normal. No respiratory distress. He has wheezes. He has no rales.  Musculoskeletal: He exhibits no edema.   Neurological: He is alert and oriented to person, place, and time. No cranial nerve deficit.  Skin: Skin is warm. He is not diaphoretic.          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 With chronic resp failure with hypoxia  COPD (chronic obstructive pulmonary disease)-moderate grade D-continues to smoke  1.Increase prednisone 40 mg daily   2.atrovent for rhinitis 3.robitussin with codeine for cough as needed 4.oxygen as needed with exertion and at night 5.albuterol NEB 3 times daily is what he takes now, can not afford all other inhalers 6.patient to call ENT again  referral for sinus problems 7. Patient cant afford inhalers at this time but can afford cigs All inhalers are expensive for patient, patient understands  DIP and RBILD-chronic prednisone use now 20 mg daily Biopsy-proven, again, he refuses to try to help himself because he continues to keep smoking. 1.Explained to him at length again today the importance of quitting smoking 2.Continue Bactrim along with prednisone for PCP prophylaxis   TOBACCO ABUSE Counseled again to quit smoking at length.  He refuses to stop at this time  OSA Continue CPAP therapy  Follow up 3 months    Patient/Family are satisfied with Plan of action and management. All questions answered Overall prognosis is very poor due to his continuing smoking with end-stage COPD with chronic respiratory failure  Wallis BambergKurian  Santiago Glad, M.D.  [facility]  Medical Director [facility] Medical Director [facility] Cardio-Pulmonary Department

## 2016-06-29 NOTE — Patient Instructions (Signed)
Increase prednisone to 40 mg daily   STOP SMOKING!!!

## 2016-07-07 ENCOUNTER — Other Ambulatory Visit: Payer: Self-pay | Admitting: *Deleted

## 2016-07-07 MED ORDER — ALBUTEROL SULFATE HFA 108 (90 BASE) MCG/ACT IN AERS: 2.0000 | INHALATION_SPRAY | Freq: Four times a day (QID) | RESPIRATORY_TRACT | 6 refills | Status: AC | PRN

## 2016-07-07 MED ORDER — SULFAMETHOXAZOLE-TRIMETHOPRIM 800-160 MG PO TABS: ORAL_TABLET | ORAL | 6 refills | Status: DC

## 2016-07-16 DIAGNOSIS — R0602 Shortness of breath: Secondary | ICD-10-CM | POA: Diagnosis not present

## 2016-07-16 DIAGNOSIS — J449 Chronic obstructive pulmonary disease, unspecified: Secondary | ICD-10-CM | POA: Diagnosis not present

## 2016-07-28 ENCOUNTER — Ambulatory Visit
Admission: RE | Admit: 2016-07-28 | Discharge: 2016-07-28 | Disposition: A | Discharge status: Home / Self Care | Payer: Medicare HMO | Source: Ambulatory Visit | Attending: Family Medicine | Admitting: Family Medicine

## 2016-07-28 ENCOUNTER — Other Ambulatory Visit: Payer: Self-pay | Admitting: Family Medicine

## 2016-07-28 DIAGNOSIS — R600 Localized edema: Secondary | ICD-10-CM

## 2016-07-28 DIAGNOSIS — M545 Low back pain: Secondary | ICD-10-CM | POA: Diagnosis not present

## 2016-07-29 DIAGNOSIS — R600 Localized edema: Secondary | ICD-10-CM | POA: Diagnosis not present

## 2016-07-30 ENCOUNTER — Ambulatory Visit: Payer: Medicare HMO

## 2016-07-30 ENCOUNTER — Other Ambulatory Visit: Payer: Self-pay | Admitting: Student

## 2016-07-30 DIAGNOSIS — S32030A Wedge compression fracture of third lumbar vertebra, initial encounter for closed fracture: Secondary | ICD-10-CM | POA: Diagnosis not present

## 2016-07-30 DIAGNOSIS — S22080A Wedge compression fracture of T11-T12 vertebra, initial encounter for closed fracture: Secondary | ICD-10-CM

## 2016-07-30 DIAGNOSIS — S22070A Wedge compression fracture of T9-T10 vertebra, initial encounter for closed fracture: Secondary | ICD-10-CM

## 2016-07-31 ENCOUNTER — Encounter (INDEPENDENT_AMBULATORY_CARE_PROVIDER_SITE_OTHER): Payer: Medicare HMO | Admitting: Vascular Surgery

## 2016-08-02 ENCOUNTER — Emergency Department
Admission: EM | Admit: 2016-08-02 | Discharge: 2016-08-19 | Disposition: E | Payer: Medicare HMO | Attending: Emergency Medicine | Admitting: Emergency Medicine

## 2016-08-02 DIAGNOSIS — F17210 Nicotine dependence, cigarettes, uncomplicated: Secondary | ICD-10-CM | POA: Diagnosis not present

## 2016-08-02 DIAGNOSIS — I499 Cardiac arrhythmia, unspecified: Secondary | ICD-10-CM | POA: Diagnosis not present

## 2016-08-02 DIAGNOSIS — I498 Other specified cardiac arrhythmias: Secondary | ICD-10-CM

## 2016-08-02 DIAGNOSIS — J449 Chronic obstructive pulmonary disease, unspecified: Secondary | ICD-10-CM | POA: Diagnosis not present

## 2016-08-02 DIAGNOSIS — I2510 Atherosclerotic heart disease of native coronary artery without angina pectoris: Secondary | ICD-10-CM | POA: Diagnosis not present

## 2016-08-02 DIAGNOSIS — I469 Cardiac arrest, cause unspecified: Secondary | ICD-10-CM | POA: Diagnosis not present

## 2016-08-02 LAB — GLUCOSE, CAPILLARY: GLUCOSE-CAPILLARY: 26 mg/dL — AB (ref 65–99)

## 2016-08-02 MED ORDER — DEXTROSE 50 % IV SOLN
INTRAVENOUS | Status: AC | PRN
  Administered 2016-08-02: 1 via INTRAVENOUS

## 2016-08-02 MED ORDER — EPINEPHRINE PF 1 MG/10ML IJ SOSY
PREFILLED_SYRINGE | INTRAMUSCULAR | Status: AC | PRN
Start: 2016-08-02 — End: 2016-08-02
  Administered 2016-08-02 (×5): 1 via INTRAVENOUS

## 2016-08-03 MED FILL — Medication: Qty: 1 | Status: AC

## 2016-08-05 ENCOUNTER — Ambulatory Visit: Payer: Medicare HMO

## 2016-08-15 DIAGNOSIS — R0602 Shortness of breath: Secondary | ICD-10-CM | POA: Diagnosis not present

## 2016-08-15 DIAGNOSIS — J449 Chronic obstructive pulmonary disease, unspecified: Secondary | ICD-10-CM | POA: Diagnosis not present

## 2016-08-19 DIAGNOSIS — 419620001 Death: Secondary | SNOMED CT | POA: Insufficient documentation

## 2016-08-19 NOTE — ED Notes (Signed)
Late Charting completed via transcription from written record due to computer failure in room during code.

## 2016-08-19 NOTE — ED Provider Notes (Signed)
[facility] Department of Emergency Medicine    Chief Complaint: Cardiac arrest/unresponsive   Level V Caveat: Unresponsive  History of present illness:  EM caveat:  Patient evidently feeling ill for several days, had noted to his wife problems with his feet and was noted to be hallucinating. She then reports that he dropped in front of her head on EMS arrival patient asystolic in cardiac arrest   ROS: Unable to obtain, Level V caveat  Scheduled Meds: Continuous Infusions: PRN Meds:. Past Medical History:  Diagnosis Date  . Acute sinusitis   . Anxiety   . CAD (coronary artery disease) 2009   stent placement @ [facility]  . COPD (chronic obstructive pulmonary disease) (HCC)   . Hyperlipidemia   . Memory loss   . OSA (obstructive sleep apnea)   . Smoker    Past Surgical History:  Procedure Laterality Date  . APPENDECTOMY    . CARDIAC CATHETERIZATION  2009   stent placed @ [facility]   . CHOLECYSTECTOMY    . KNEE SURGERY     bilateral knee surgery  . LUNG BIOPSY     x 2    Social History   Social History  . Marital status: Married    Spouse name: N/A  . Number of children: N/A  . Years of education: N/A   Occupational History  . Not on file.   Social History Main Topics  . Smoking status: Current Every Day Smoker    Packs/day: 0.75    Years: 33.00    Types: Cigarettes  . Smokeless tobacco: Never Used     Comment: .5-.75 pack/day  . Alcohol use No  . Drug use: No  . Sexual activity: Not on file   Other Topics Concern  . Not on file   Social History Narrative  . No narrative on file   Allergies  Allergen Reactions  . Advair Diskus  [Fluticasone-Salmeterol] Swelling  . Clarithromycin     REACTION: [place]  . Penicillins     REACTION: [place]    Last set of Vital Signs (not current) There were no vitals filed for this visit.    Physical Exam  Gen: unresponsive Cardiovascular: pulseless  Resp: apneic. Breath sounds equal bilaterally with  bagging, King airway in place, and tidal CO2 9 with Square waveform by continuous capnography Abd: nondistended  Neuro: GCS 3, unresponsive to pain  Neck: No crepitus  Musculoskeletal: No deformity no lower shin edema noted Skin: Cool with some peripheral pallor  CRITICAL CARE Performed by: Sharyn Creamer Total critical care time: 25 Critical care time was exclusive of separately billable procedures and treating other patients. Critical care was necessary to treat or prevent imminent or life-threatening deterioration. Critical care was time spent personally by me on the following activities: development of treatment plan with patient and/or surrogate as well as nursing, discussions with consultants, evaluation of patient's response to treatment, examination of patient, obtaining history from patient or surrogate, ordering and performing treatments and interventions, ordering and review of laboratory studies, ordering and review of radiographic studies, pulse oximetry and re-evaluation of patient's condition.    Cardiopulmonary Resuscitation (CPR) Procedure Note  Directed/Performed by: Sharyn Creamer I personally directed ancillary staff and/or performed CPR in an effort to regain return of spontaneous circulation and to maintain cardiac, neuro and systemic perfusion.    Medical Decision making  Patient presents in cardiac arrest. As found hypoglycemic by EMS, was given D50 with improvement in glucose to 75. On arrival to  the ER blood glucose 25, but the patient is notably cool peripherally with very poor circulation. He is given an additional amp of D50 here without resultant change in cardiorespiratory condition.  The patient's airway was secured prehospital. Airway confirmed by end tidal CO2, breath sounds, waveform capnography. The patient was asystolic initially, then briefly converted to agonal-appearing rhythm, then converting back to an asystolic/agonal appearance.  After a total of  almost one hour of continuous CPR including EMS and ER time, the patient's wife and family arrives after discussion with his wife who is his next of kin decision was made to discontinue resuscitation efforts given the fact that even if return of circulation was possible, which likely was not, he would likely have devastating brain injury. He had no corneal reflex. The pulse work fixed and dilated. No return of circulation, though it one point there is a brief question if the pulse was present for only a few seconds, but quickly returning to pulseless   Assessment and Plan  Patient expired  ME, [place], and case reviewed with him. Patient has not a medical examiner candidate. Patient Dr. Illene LabradorJazmine Singh's office to notify of death and requests signing of death certificate.    Sharyn CreamerQuale, Nickisha Hum, MD 10-14-2016 [phone]

## 2016-08-19 NOTE — ED Notes (Signed)
[facility] Arrival, CPR in Progress via Samuel BoucheLucas device  EMS Reports witnessed vs unwitnessed arrest. (Conflicting stories by family per EMS Crew)  Given PTA: 3X Epi,  1X Bicarb, 1X Narcan, 1X D50.  Code length PTA: 42 minutes.

## 2016-08-19 NOTE — Progress Notes (Signed)
Chaplain received a CPR page. Chaplain responded and went to the ED department. Chaplain was told to assist the family because the patient  Was going to be declared dead and CPR measured was going to be stopped. Chaplain assisted family and offered prayer for the family during their time of Grief. Chaplin escorted family back to family waiting room. Chaplin got drinks And water for the family. Then Chaplin got the nurse to fill out the funeral home  release paper then the family left.

## 2016-08-19 DEATH — deceased

## 2016-10-05 ENCOUNTER — Ambulatory Visit: Payer: Medicare HMO | Admitting: Internal Medicine

## 2018-01-13 IMAGING — US US EXTREM LOW VENOUS*R*
1 series · 13 of 24 positions shown · non-contrast
Comparison: None.

CLINICAL DATA: RIGHT lower extremity edema for 1 month. Assess for
deep vein thrombosis.



[Series 1: us extrem low venous*right* · 0.07mm/px · 13 of 36 slices shown]
[im 1/36]
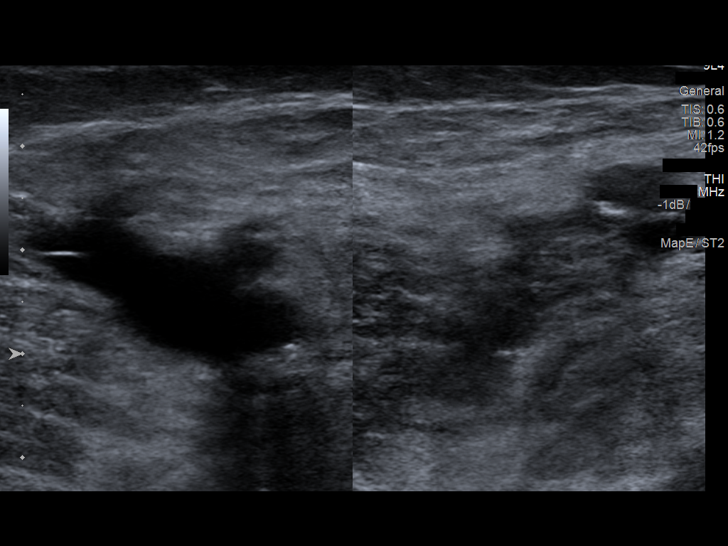
[im 4/36]
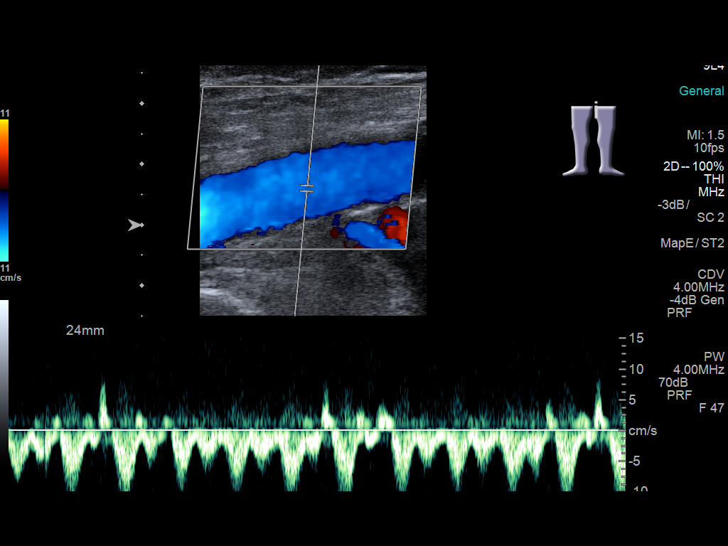
[im 7/36]
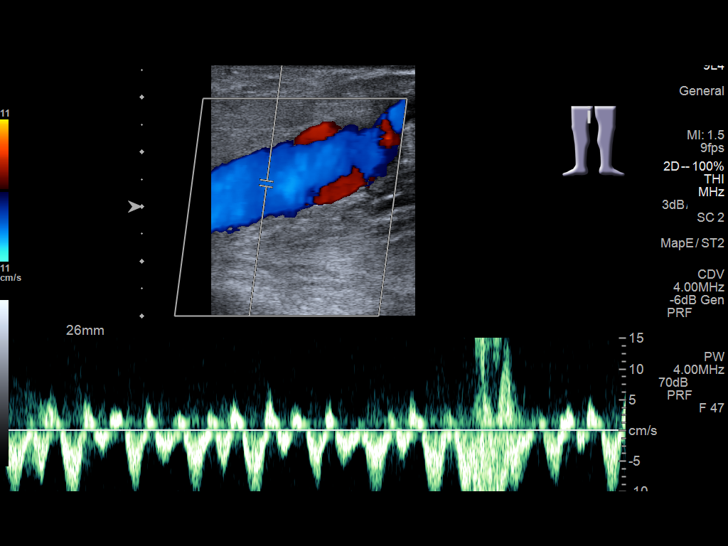
[im 10/36]
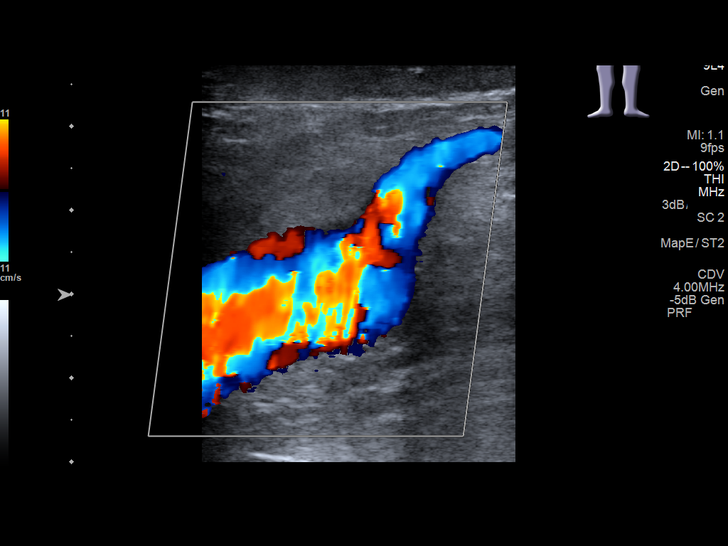
[im 13/36]
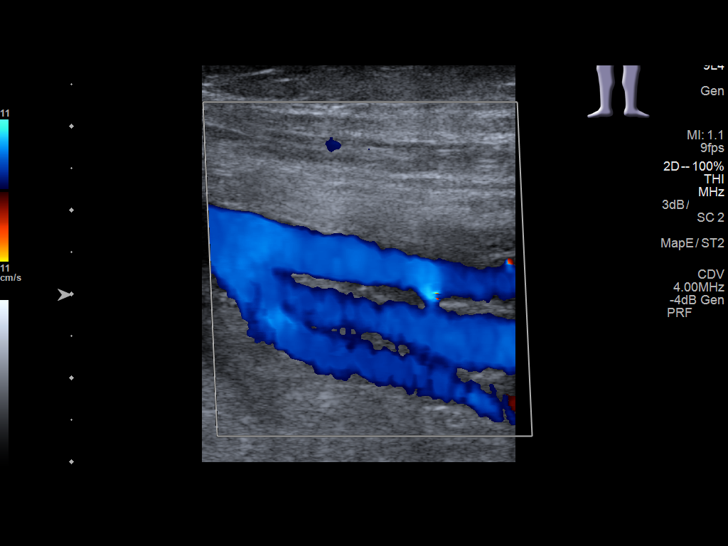
[im 16/36]
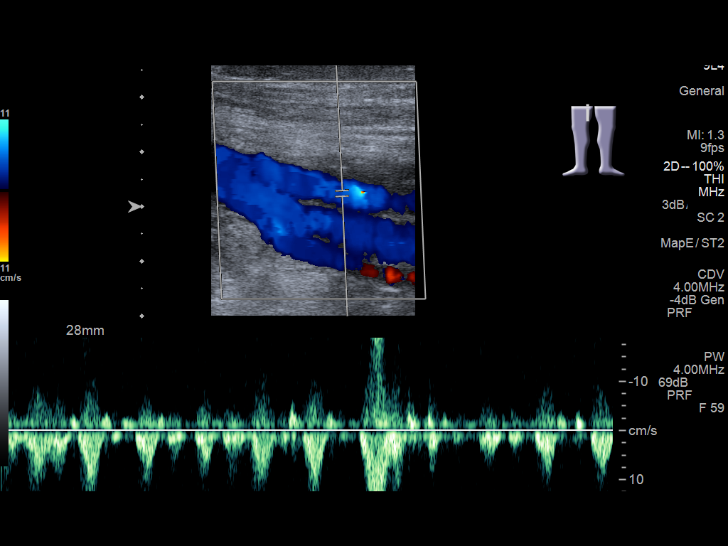
[im 19/36]
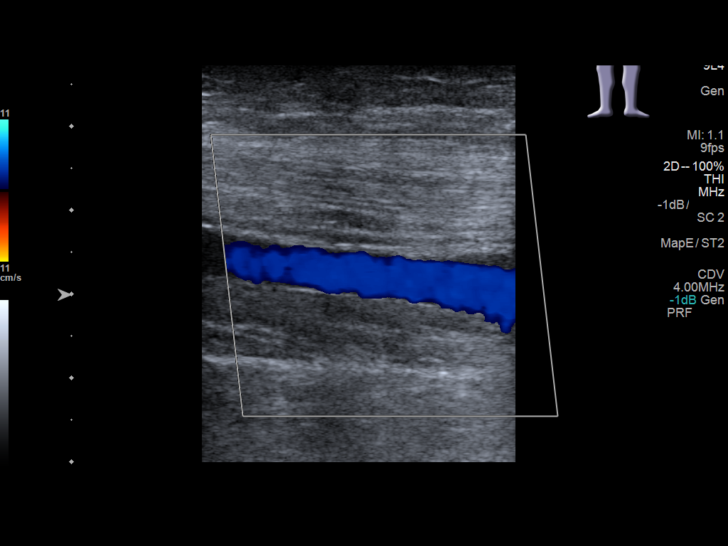
[im 20/36]
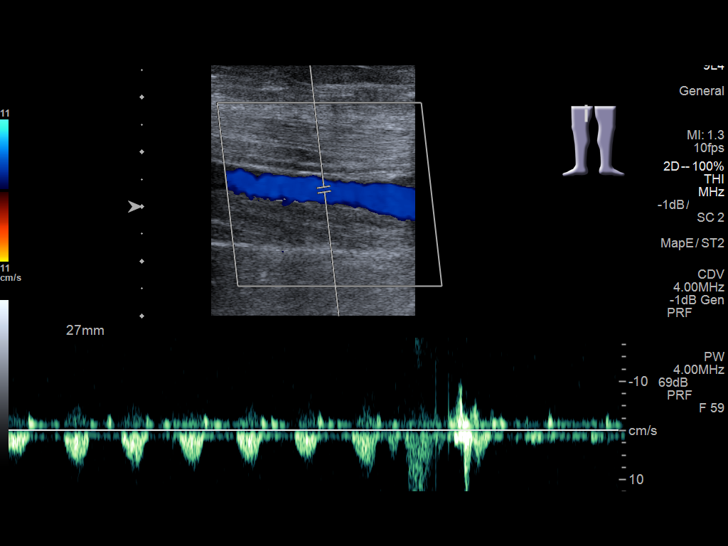
[im 23/36]
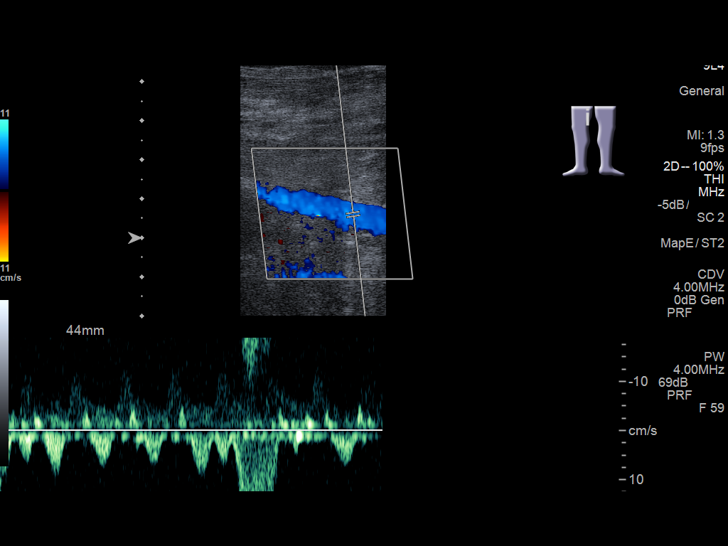
[im 26/36]
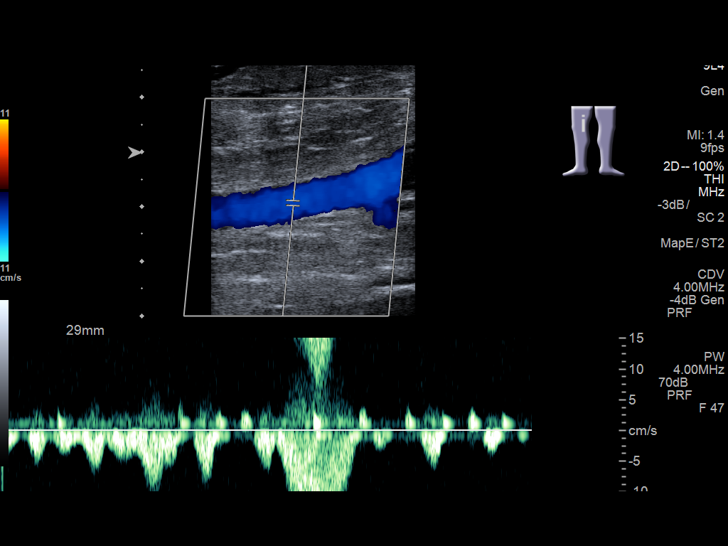
[im 29/36]
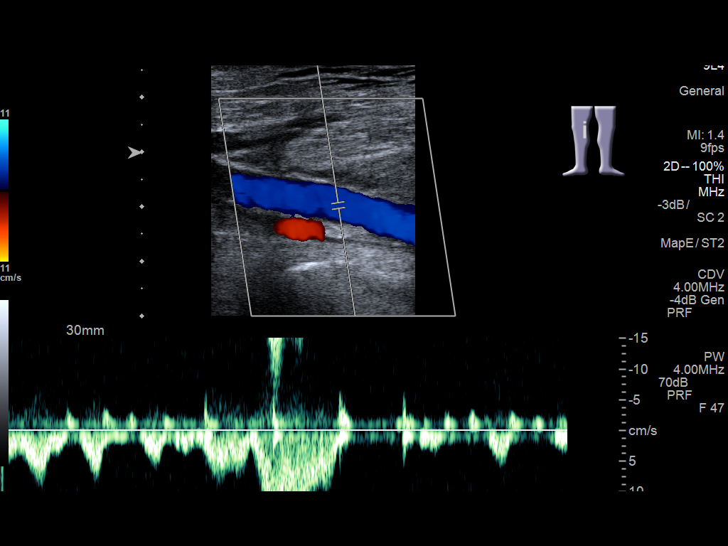
[im 32/36]
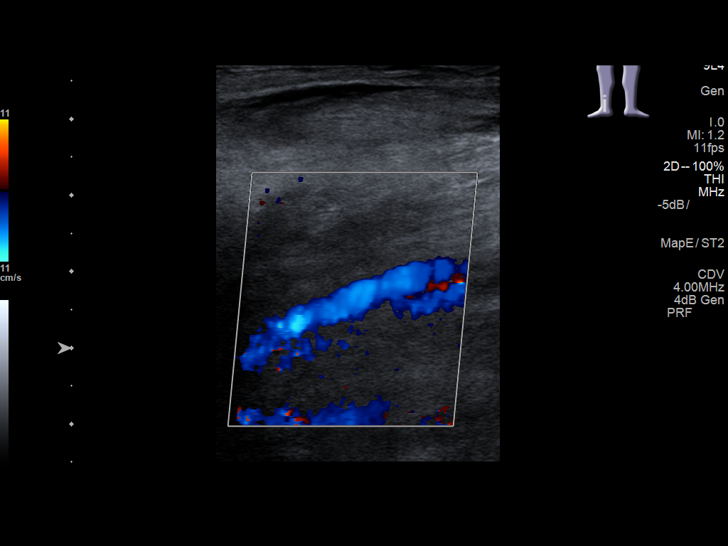
[im 36/36]
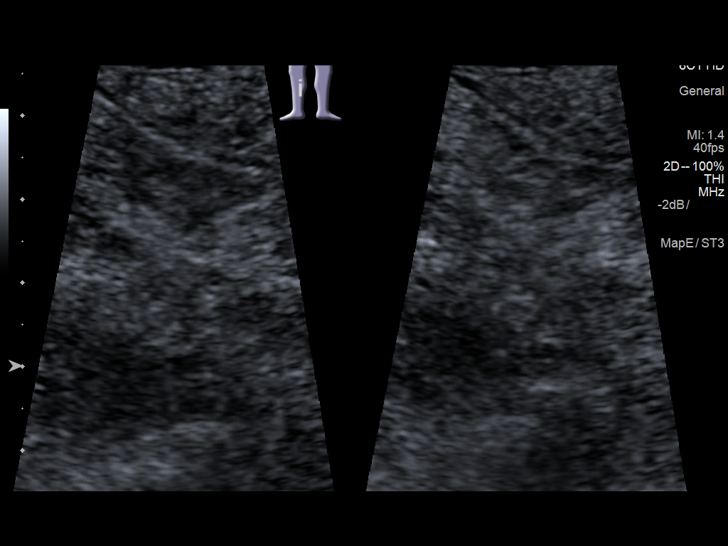

[13 of 24 positions shown; findings below may reference images not displayed]

FINDINGS: Pulsatile venous waveforms throughout the RIGHT lower extremity.

Contralateral Common Femoral Vein: Respiratory phasicity is normal
and symmetric with the symptomatic side. No evidence of thrombus.
Normal compressibility.

Common Femoral Vein: No evidence of thrombus. Normal
compressibility, respiratory phasicity and response to augmentation.

Saphenofemoral Junction: No evidence of thrombus. Normal
compressibility and flow on color Doppler imaging.

Profunda Femoral Vein: No evidence of thrombus. Normal
compressibility and flow on color Doppler imaging.

Femoral Vein: No evidence of thrombus. Normal compressibility,
respiratory phasicity and response to augmentation.

Popliteal Vein: No evidence of thrombus. Normal compressibility,
respiratory phasicity and response to augmentation.

Calf Veins: No evidence of thrombus. Normal compressibility and flow
on color Doppler imaging.

Superficial Great Saphenous Vein: No evidence of thrombus. Normal
compressibility and flow on color Doppler imaging.

Other Findings:  Mild interstitial edema.
IMPRESSION: No RIGHT lower extremity deep vein thrombosis.

Pulsatile venous waveforms seen with RIGHT heart failure. Recommend
radiograph.

Mild interstitial edema.
